# Patient Record
Sex: Male | Born: 1960 | Race: White | Hispanic: No | Marital: Married | Smoking: Current every day smoker
Health system: Southern US, Community
[De-identification: ages and names within clinical notes are randomized; demographics above are authoritative.]

## PROBLEM LIST (undated history)

## (undated) DIAGNOSIS — E119 Type 2 diabetes mellitus without complications: Secondary | ICD-10-CM

## (undated) DIAGNOSIS — I2510 Atherosclerotic heart disease of native coronary artery without angina pectoris: Secondary | ICD-10-CM

## (undated) DIAGNOSIS — E785 Hyperlipidemia, unspecified: Secondary | ICD-10-CM

## (undated) DIAGNOSIS — Z720 Tobacco use: Secondary | ICD-10-CM

## (undated) DIAGNOSIS — E669 Obesity, unspecified: Secondary | ICD-10-CM

## (undated) HISTORY — PX: COLON RESECTION: SHX5231

---

## 2016-12-20 ENCOUNTER — Inpatient Hospital Stay (HOSPITAL_COMMUNITY)
Admission: EM | Admit: 2016-12-20 | Discharge: 2016-12-29 | DRG: 233 | Disposition: A | Discharge status: Home / Self Care | Payer: BLUE CROSS/BLUE SHIELD | Attending: Cardiothoracic Surgery | Admitting: Cardiothoracic Surgery

## 2016-12-20 ENCOUNTER — Emergency Department (HOSPITAL_COMMUNITY): Payer: BLUE CROSS/BLUE SHIELD

## 2016-12-20 ENCOUNTER — Encounter (HOSPITAL_COMMUNITY): Payer: Self-pay | Admitting: *Deleted

## 2016-12-20 DIAGNOSIS — I255 Ischemic cardiomyopathy: Secondary | ICD-10-CM | POA: Diagnosis present

## 2016-12-20 DIAGNOSIS — Z7982 Long term (current) use of aspirin: Secondary | ICD-10-CM

## 2016-12-20 DIAGNOSIS — I214 Non-ST elevation (NSTEMI) myocardial infarction: Principal | ICD-10-CM

## 2016-12-20 DIAGNOSIS — I493 Ventricular premature depolarization: Secondary | ICD-10-CM | POA: Diagnosis present

## 2016-12-20 DIAGNOSIS — E669 Obesity, unspecified: Secondary | ICD-10-CM | POA: Diagnosis present

## 2016-12-20 DIAGNOSIS — I5023 Acute on chronic systolic (congestive) heart failure: Secondary | ICD-10-CM | POA: Diagnosis not present

## 2016-12-20 DIAGNOSIS — E1159 Type 2 diabetes mellitus with other circulatory complications: Secondary | ICD-10-CM

## 2016-12-20 DIAGNOSIS — Z8249 Family history of ischemic heart disease and other diseases of the circulatory system: Secondary | ICD-10-CM

## 2016-12-20 DIAGNOSIS — Z951 Presence of aortocoronary bypass graft: Secondary | ICD-10-CM

## 2016-12-20 DIAGNOSIS — E119 Type 2 diabetes mellitus without complications: Secondary | ICD-10-CM | POA: Diagnosis not present

## 2016-12-20 DIAGNOSIS — E11649 Type 2 diabetes mellitus with hypoglycemia without coma: Secondary | ICD-10-CM | POA: Diagnosis not present

## 2016-12-20 DIAGNOSIS — I2510 Atherosclerotic heart disease of native coronary artery without angina pectoris: Secondary | ICD-10-CM | POA: Diagnosis present

## 2016-12-20 DIAGNOSIS — Z6833 Body mass index (BMI) 33.0-33.9, adult: Secondary | ICD-10-CM

## 2016-12-20 DIAGNOSIS — Z79899 Other long term (current) drug therapy: Secondary | ICD-10-CM

## 2016-12-20 DIAGNOSIS — Z09 Encounter for follow-up examination after completed treatment for conditions other than malignant neoplasm: Secondary | ICD-10-CM

## 2016-12-20 DIAGNOSIS — F17200 Nicotine dependence, unspecified, uncomplicated: Secondary | ICD-10-CM

## 2016-12-20 DIAGNOSIS — E781 Pure hyperglyceridemia: Secondary | ICD-10-CM | POA: Diagnosis present

## 2016-12-20 DIAGNOSIS — I2584 Coronary atherosclerosis due to calcified coronary lesion: Secondary | ICD-10-CM | POA: Diagnosis present

## 2016-12-20 DIAGNOSIS — Z9689 Presence of other specified functional implants: Secondary | ICD-10-CM

## 2016-12-20 DIAGNOSIS — J9811 Atelectasis: Secondary | ICD-10-CM

## 2016-12-20 DIAGNOSIS — K219 Gastro-esophageal reflux disease without esophagitis: Secondary | ICD-10-CM | POA: Diagnosis present

## 2016-12-20 DIAGNOSIS — E785 Hyperlipidemia, unspecified: Secondary | ICD-10-CM | POA: Diagnosis present

## 2016-12-20 DIAGNOSIS — E1165 Type 2 diabetes mellitus with hyperglycemia: Secondary | ICD-10-CM | POA: Diagnosis present

## 2016-12-20 DIAGNOSIS — R079 Chest pain, unspecified: Secondary | ICD-10-CM | POA: Diagnosis not present

## 2016-12-20 DIAGNOSIS — I429 Cardiomyopathy, unspecified: Secondary | ICD-10-CM

## 2016-12-20 DIAGNOSIS — I110 Hypertensive heart disease with heart failure: Secondary | ICD-10-CM | POA: Diagnosis present

## 2016-12-20 DIAGNOSIS — F17210 Nicotine dependence, cigarettes, uncomplicated: Secondary | ICD-10-CM | POA: Diagnosis present

## 2016-12-20 HISTORY — DX: Tobacco use: Z72.0

## 2016-12-20 HISTORY — DX: Obesity, unspecified: E66.9

## 2016-12-20 HISTORY — DX: Type 2 diabetes mellitus without complications: E11.9

## 2016-12-20 HISTORY — DX: Hyperlipidemia, unspecified: E78.5

## 2016-12-20 LAB — CBC
HEMATOCRIT: 46.3 % (ref 39.0–52.0)
Hemoglobin: 16.1 g/dL (ref 13.0–17.0)
MCH: 33.1 pg (ref 26.0–34.0)
MCHC: 34.8 g/dL (ref 30.0–36.0)
MCV: 95.1 fL (ref 78.0–100.0)
Platelets: 152 10*3/uL (ref 150–400)
RBC: 4.87 MIL/uL (ref 4.22–5.81)
RDW: 12.9 % (ref 11.5–15.5)
WBC: 8.6 10*3/uL (ref 4.0–10.5)

## 2016-12-20 LAB — BASIC METABOLIC PANEL
ANION GAP: 9 (ref 5–15)
BUN: 21 mg/dL — ABNORMAL HIGH (ref 6–20)
CO2: 27 mmol/L (ref 22–32)
Calcium: 9 mg/dL (ref 8.9–10.3)
Chloride: 100 mmol/L — ABNORMAL LOW (ref 101–111)
Creatinine, Ser: 0.93 mg/dL (ref 0.61–1.24)
GFR calc non Af Amer: >60 mL/min (ref >60)
Glucose, Bld: 148 mg/dL — ABNORMAL HIGH (ref 65–99)
POTASSIUM: 3.7 mmol/L (ref 3.5–5.1)
SODIUM: 136 mmol/L (ref 135–145)

## 2016-12-20 LAB — I-STAT TROPONIN, ED: Troponin i, poc: 0.3 ng/mL (ref 0.00–0.08)

## 2016-12-20 MED ORDER — HEPARIN BOLUS VIA INFUSION
4000.0000 [IU] | Freq: Once | INTRAVENOUS | Status: AC
  Administered 2016-12-20: 4000 [IU] via INTRAVENOUS

## 2016-12-20 MED ORDER — INSULIN ASPART 100 UNIT/ML ~~LOC~~ SOLN
0.0000 [IU] | Freq: Three times a day (TID) | SUBCUTANEOUS | Status: DC
  Administered 2016-12-21 (×2): 2 [IU] via SUBCUTANEOUS

## 2016-12-20 MED ORDER — ONDANSETRON HCL 4 MG/2ML IJ SOLN
4.0000 mg | Freq: Four times a day (QID) | INTRAMUSCULAR | Status: DC | PRN
  Administered 2016-12-24 – 2016-12-26 (×4): 4 mg via INTRAVENOUS
  Filled 2016-12-20 (×5): qty 2

## 2016-12-20 MED ORDER — ASPIRIN 325 MG PO TABS
325.0000 mg | ORAL_TABLET | Freq: Every day | ORAL | Status: DC
  Filled 2016-12-20: qty 1

## 2016-12-20 MED ORDER — PRAVASTATIN SODIUM 40 MG PO TABS
40.0000 mg | ORAL_TABLET | Freq: Every day | ORAL | Status: DC
  Filled 2016-12-20: qty 1

## 2016-12-20 MED ORDER — FAMOTIDINE 20 MG PO TABS
20.0000 mg | ORAL_TABLET | Freq: Every day | ORAL | Status: DC
  Administered 2016-12-21 – 2016-12-29 (×7): 20 mg via ORAL
  Filled 2016-12-20 (×7): qty 1

## 2016-12-20 MED ORDER — ACETAMINOPHEN 325 MG PO TABS: 650.0000 mg | ORAL_TABLET | ORAL | Status: DC | PRN

## 2016-12-20 MED ORDER — HEPARIN (PORCINE) IN NACL 100-0.45 UNIT/ML-% IJ SOLN
1800.0000 [IU]/h | INTRAMUSCULAR | Status: DC
  Administered 2016-12-20: 1500 [IU]/h via INTRAVENOUS
  Administered 2016-12-21 – 2016-12-22 (×2): 1800 [IU]/h via INTRAVENOUS
  Filled 2016-12-20 (×4): qty 250

## 2016-12-20 MED ORDER — ASPIRIN 81 MG PO CHEW
324.0000 mg | CHEWABLE_TABLET | Freq: Once | ORAL | Status: AC
  Administered 2016-12-20: 324 mg via ORAL
  Filled 2016-12-20: qty 4

## 2016-12-20 NOTE — ED Provider Notes (Signed)
[facility]-EM[facility]ENCY DEPT Provider Note   CSN: [id number] Arrival date & time: 12/20/16  1753     History   Chief Complaint Chief Complaint  Patient presents with  . Chest Pain    HPI Brad Olson is a 56 y.o. male.  HPI Patient presents after episodes of chest pain. He has had 3 episodes of it over the last 2 weeks. Ms. pressure to the left side of the chest. It is come on after eating and relieved by antacids. States he had it Saturday evening. Has not had pain in a couple days. States that he helped his son move and did not have pain during that but that evening did have some pain. Family member states he looked like someone that they had seen having a heart attack. He is a smoker. History of diabetes and hypertension and high cholesterol. No previous cardiac workup. No diaphoresis. Pain had been severe. Pain-free now. Not described as a tearing pain. No abdominal pain.   Past Medical History:  Diagnosis Date  . Diabetes mellitus without complication (HCC)   . Hypertension     Patient Active Problem List   Diagnosis Date Noted  . NSTEMI (non-ST elevated myocardial infarction) (HCC) 12/20/2016  . Diabetes mellitus (HCC) 12/20/2016    Past Surgical History:  Procedure Laterality Date  . COLON RESECTION     due to bowel rupture       Home Medications    Prior to Admission medications   Medication Sig Start Date End Date Taking? Authorizing Provider  aspirin EC 81 MG tablet Take 81 mg by mouth daily.   Yes Historical Provider, MD  DiphenhydrAMINE HCl, Sleep, (SLEEP AID) 50 MG CAPS Take 50 mg by mouth at bedtime.   Yes Historical Provider, MD  glyBURIDE (DIABETA) 5 MG tablet Take 5 mg by mouth 2 (two) times daily. 11/10/16  Yes Historical Provider, MD  JARDIANCE 25 MG TABS tablet Take 25 mg by mouth daily. 11/22/16  Yes Historical Provider, MD  Multiple Vitamin (MULTIVITAMIN WITH MINERALS) TABS tablet Take 1 tablet by mouth daily.   Yes Historical Provider, MD    pravastatin (PRAVACHOL) 40 MG tablet Take 40 mg by mouth daily. 11/18/16  Yes Historical Provider, MD  ranitidine (ZANTAC) 75 MG tablet Take 150 mg by mouth every evening.   Yes Historical Provider, MD    Family History No family history on file.  Social History Social History  Substance Use Topics  . Smoking status: Current Every Day Smoker    Packs/day: 1.00  . Smokeless tobacco: Never Used  . Alcohol use No     Allergies   Nicotine   Review of Systems Review of Systems  Constitutional: Negative for appetite change, diaphoresis and fever.  Cardiovascular: Positive for chest pain.  Gastrointestinal: Negative for abdominal pain.  Genitourinary: Negative for flank pain.  Musculoskeletal: Negative for back pain.  Neurological: Negative for syncope and numbness.  Hematological: Negative for adenopathy.     Physical Exam Updated Vital Signs BP 134/73   Pulse 79   Temp 98.4 F (36.9 C) (Oral)   Resp 18   Ht 5\' 11"  (1.803 m)   Wt 241 lb (109.3 kg)   SpO2 96%   BMI 33.61 kg/m   Physical Exam  Constitutional: He appears well-developed.  HENT:  Head: Atraumatic.  Eyes: EOM are normal.  Cardiovascular: Normal rate.   Pulmonary/Chest: Effort normal.  Abdominal: Soft. There is no tenderness.  Musculoskeletal: He exhibits no edema.  Neurological: He is  alert.  Skin: Skin is warm. Capillary refill takes less than 2 seconds.    4 views of 3 a ED Treatments / Results  Labs (all labs ordered are listed, but only abnormal results are displayed) Labs Reviewed  BASIC METABOLIC PANEL - Abnormal; Notable for the following:       Result Value   Chloride 100 (*)    Glucose, Bld 148 (*)    BUN 21 (*)    All other components within normal limits  I-STAT TROPOININ, ED - Abnormal; Notable for the following:    Troponin i, poc 0.30 (*)    All other components within normal limits  CBC  HEPARIN LEVEL (UNFRACTIONATED)  CBC    EKG  EKG  Interpretation  Date/Time:  Monday December 20 2016 17:57:49 EDT Ventricular Rate:  90 PR Interval:  180 QRS Duration: 96 QT Interval:  370 QTC Calculation: 452 R Axis:   -34 Text Interpretation:  Sinus rhythm with frequent Premature ventricular complexes Left axis deviation Septal infarct , age undetermined Abnormal ECG Confirmed by Rubin Payor  MD, Denym Christenberry [phone]) on 12/20/2016 6:57:08 PM       Radiology Dg Chest 2 View  Result Date: 12/20/2016 CLINICAL DATA:  Left-sided chest pain radiates into the shoulder. EXAM: CHEST  2 VIEW COMPARISON:  None. FINDINGS: The heart size and mediastinal contours are within normal limits. Both lungs are clear. The visualized skeletal structures are unremarkable. IMPRESSION: No active cardiopulmonary disease. Electronically Signed   By: Kennith Center M.D.   On: 12/20/2016 18:27    Procedures Procedures (including critical care time)  Medications Ordered in ED Medications  heparin ADULT infusion 100 units/mL (25000 units/250mL sodium chloride 0.45%) (1,500 Units/hr Intravenous New Bag/Given 12/20/16 2105)  aspirin chewable tablet 324 mg (324 mg Oral Given 12/20/16 2023)  heparin bolus via infusion 4,000 Units (4,000 Units Intravenous Bolus from Bag 12/20/16 2105)     Initial Impression / Assessment and Plan / ED Course  I have reviewed the triage vital signs and the nursing notes.  Pertinent labs & imaging results that were available during my care of the patient were reviewed by me and considered in my medical decision making (see chart for details).     Patient with chest pain. Has not had chest pain a couple days however the story was somewhat worrisome and that it was pressure on the left side of his chest. He did however not come on with exertion. EKG reassuring but his initial troponin is 0.3. Heparin started. Discussed with Dr. Diona Browner cardiology. Thinks the patient can remain up at [facility] for further evaluation unless the troponin significantly  elevates her pain returns. Admitted to internal medicine.  CRITICAL CARE Performed by: Billee Cashing Total critical care time: 30 minutes Critical care time was exclusive of separately billable procedures and treating other patients. Critical care was necessary to treat or prevent imminent or life-threatening deterioration. Critical care was time spent personally by me on the following activities: development of treatment plan with patient and/or surrogate as well as nursing, discussions with consultants, evaluation of patient's response to treatment, examination of patient, obtaining history from patient or surrogate, ordering and performing treatments and interventions, ordering and review of laboratory studies, ordering and review of radiographic studies, pulse oximetry and re-evaluation of patient's condition.   Final Clinical Impressions(s) / ED Diagnoses   Final diagnoses:  NSTEMI (non-ST elevated myocardial infarction) [employer])    New Prescriptions New Prescriptions   No medications on file  Benjiman Core, MD 12/20/16 2141

## 2016-12-20 NOTE — ED Triage Notes (Signed)
Pt c/o left sided chest pain 1 week ago. Pt took some heartburn medication with relief after a few minutes. Pt saw his PCP today and wanted pt to be seen due to the CP and risk factors of DM, HTN, smoker. Pt denies radiation of pain, nausea, vomiting, dizziness. Denies chest pain at this time.

## 2016-12-20 NOTE — ED Notes (Signed)
Dr. Lama at bedside. 

## 2016-12-20 NOTE — Progress Notes (Signed)
ANTICOAGULATION CONSULT NOTE - Initial Consult  Pharmacy Consult for heparin Indication: chest pain/ACS  Allergies  Allergen Reactions  . Nicotine Rash    Nicotine patch only.    Patient Measurements: Height: 5\' 11"  (180.3 cm) Weight: 241 lb (109.3 kg) IBW/kg (Calculated) : 75.3 Heparin Dosing Weight: 98.7 kg  Vital Signs: Temp: 98.4 F (36.9 C) (04/09 1802) Temp Source: Oral (04/09 1802) BP: 150/86 (04/09 1911) Pulse Rate: 90 (04/09 1911)  Labs: No results for input(s): HGB, HCT, PLT, APTT, LABPROT, INR, HEPARINUNFRC, HEPRLOWMOCWT, CREATININE, CKTOTAL, CKMB, TROPONINI in the last 72 hours.  CrCl cannot be calculated (No order found.).   Medical History: Past Medical History:  Diagnosis Date  . Diabetes mellitus without complication (HCC)   . Hypertension     Medications:  See medication history  Assessment: 56 yo man to start heparin for CP.  He was not on anticoagulation PTA Goal of Therapy:  Heparin level 0.3-0.7 units/ml Monitor platelets by anticoagulation protocol: Yes   Plan:  Heparin 4000 unit bolus and drip at 1500 units/hr Check heparin level ~8 hours after start and daily while on heparin CBC daily Monitor for bleeding complications  Kelley Knoth Poteet 12/20/2016,7:59 PM

## 2016-12-20 NOTE — H&P (Signed)
[facility] H&P    Patient Demographics:    Brad Olson, is a 56 y.o. male  MRN: [id number]  DOB - 08-24-1961  Admit Date - 12/20/2016  Referring MD/NP/PA: Dr. Rubin Payor  Outpatient Primary MD for the patient is Vennie Homans, MD  Patient coming from: Home  Chief Complaint  Patient presents with  . Chest Pain      HPI:    Brad Olson  is a 56 y.o. male, With history of diabetes mellitus, hyperlipidemia who came to ED with complaint of chest pain was started for past 2 weeks. Patient had 3 episodes of chest pain or past 2 weeks. On Saturday he felt pressure feeling in the left side of chest. It improved after taking some antacids. Patient says that he was on a trip to [place] for his job and stated that from Monday to Friday. He came back on Friday night and had pain on Saturday. He does not have any history of CAD. Does have strong family history of CAD. Patient's father had MI in his 37s. Patient has a history of diabetes mellitus, hypertension, hyperlipidemia.  In ED, lab work showed elevated troponin 0.30. Cardiology was consulted by ED physician, Dr. Lilian Kapur recommended to keep patient at [facility]. Patient started on heparin protocol, given aspirin. He is currently chest pain-free.  Patient denies history of shortness of breath, no nausea vomiting or diarrhea.    Review of systems:    In addition to the HPI above,  No Fever-chills, No Headache, No changes with Vision or hearing, No problems swallowing food or Liquids, No Abdominal pain, No Nausea or Vomiting, bowel movements are regular, No Blood in stool or Urine, No dysuria, No new skin rashes or bruises, No new joints pains-aches,  No new weakness, tingling, numbness in any extremity, No recent weight gain or loss, No polyuria, polydypsia or polyphagia, No significant Mental Stressors.  A full 10 point Review of Systems  was done, except as stated above, all other Review of Systems were negative.   With Past History of the following :    Past Medical History:  Diagnosis Date  . Diabetes mellitus without complication (HCC)   . Hypertension       Past Surgical History:  Procedure Laterality Date  . COLON RESECTION     due to bowel rupture      Social History:      Social History  Substance Use Topics  . Smoking status: Current Every Day Smoker    Packs/day: 1.00  . Smokeless tobacco: Never Used  . Alcohol use No       Family History :    Patient's father had MI in his 50s   Home Medications:   Prior to Admission medications   Medication Sig Start Date End Date Taking? Authorizing Provider  aspirin EC 81 MG tablet Take 81 mg by mouth daily.   Yes Historical Provider, MD  DiphenhydrAMINE HCl, Sleep, (SLEEP AID) 50 MG CAPS Take 50 mg by mouth at bedtime.   Yes Historical Provider, MD  glyBURIDE (DIABETA) 5 MG tablet Take 5 mg by mouth 2 (two) times daily. 11/10/16  Yes Historical Provider, MD  JARDIANCE 25 MG TABS tablet Take 25 mg by mouth daily. 11/22/16  Yes Historical Provider, MD  Multiple Vitamin (MULTIVITAMIN WITH MINERALS) TABS tablet Take 1 tablet by mouth daily.   Yes Historical Provider, MD  pravastatin (PRAVACHOL) 40 MG tablet Take 40 mg by mouth daily. 11/18/16  Yes Historical Provider, MD  ranitidine (ZANTAC) 75 MG tablet Take 150 mg by mouth every evening.   Yes Historical Provider, MD     Allergies:     Allergies  Allergen Reactions  . Nicotine Rash    Nicotine patch only.     Physical Exam:   Vitals  Blood pressure (!) 149/76, pulse 87, temperature 98.4 F (36.9 C), temperature source Oral, resp. rate 15, height 5\' 11"  (1.803 m), weight 109.3 kg (241 lb), SpO2 96 %.  1.  General: Appears in no acute distress  2. Psychiatric:  Intact judgement and  insight, awake alert, oriented x 3.  3. Neurologic: No focal neurological deficits, all cranial nerves  intact.Strength 5/5 all 4 extremities, sensation intact all 4 extremities, plantars down going.  4. Eyes :  anicteric sclerae, moist conjunctivae with no lid lag. PERRLA.  5. ENMT:  Oropharynx clear with moist mucous membranes and good dentition  6. Neck:  supple, no cervical lymphadenopathy appriciated, No thyromegaly  7. Respiratory : Normal respiratory effort, good air movement bilaterally,clear to  auscultation bilaterally  8. Cardiovascular : RRR, no gallops, rubs or murmurs, no leg edema  9. Gastrointestinal:  Positive bowel sounds, abdomen soft, non-tender to palpation,no hepatosplenomegaly, no rigidity or guarding       10. Skin:  No cyanosis, normal texture and turgor, no rash, lesions or ulcers  11.Musculoskeletal:  Good muscle tone,  joints appear normal , no effusions,  normal range of motion    Data Review:    CBC  Recent Labs Lab 12/20/16 1911  WBC 8.6  HGB 16.1  HCT 46.3  PLT 152  MCV 95.1  MCH 33.1  MCHC 34.8  RDW 12.9   ------------------------------------------------------------------------------------------------------------------  Chemistries   Recent Labs Lab 12/20/16 1911  NA 136  K 3.7  CL 100*  CO2 27  GLUCOSE 148*  BUN 21*  CREATININE 0.93  CALCIUM 9.0   ------------------------------------------------------------------------------------------------------------------  ------------------------------------------------------------------------------------------------------------------  --------------------------------------------------------------------------------------------------------------- Urine analysis: No results found for: COLORURINE, APPEARANCEUR, LABSPEC, PHURINE, GLUCOSEU, HGBUR, BILIRUBINUR, KETONESUR, PROTEINUR, UROBILINOGEN, NITRITE, LEUKOCYTESUR    Imaging Results:    Dg Chest 2 View  Result Date: 12/20/2016 CLINICAL DATA:  Left-sided chest pain radiates into the shoulder. EXAM: CHEST  2 VIEW COMPARISON:   None. FINDINGS: The heart size and mediastinal contours are within normal limits. Both lungs are clear. The visualized skeletal structures are unremarkable. IMPRESSION: No active cardiopulmonary disease. Electronically Signed   By: Kennith Center M.D.   On: 12/20/2016 18:27    My personal review of EKG: Rhythm NSR with PVCs, left axis deviation   Assessment & Plan:    Active Problems:   NSTEMI (non-ST elevated myocardial infarction) (HCC)   Diabetes mellitus (HCC)   1. Acute coronary syndrome- patient came with chest pain, which is currently resolved. Found to have elevated troponin 0.30, patient started on heparin protocol, continue aspirin 325 mg by mouth daily. Will obtain echocardiogram in a.m. Cardiology to see in a.m. for cardiac stress test. 2. Diabetes mellitus-hold glyburide and Jardiance. We'll start sliding scale insulin with NovoLog. 3.  Hyperlipidemia-continue Pravachol 4. GERD-continue Zantac.   DVT Prophylaxis-   Heparin  AM Labs Ordered, also please review Full Orders  Family Communication: Admission, patients condition and plan of care including tests being ordered have been discussed with the patient and * who indicate understanding and agree with the plan and Code Status.  Code Status: Full code  Admission status: Observation    Time spent in minutes : 60 minutes   Eusebia Grulke S M.D on 12/20/2016 at 8:37 PM  Between 7am to 7pm - Pager - [phone]. After 7pm go to [email or web address] - password [facility]  [facility] - Office  [phone]

## 2016-12-21 ENCOUNTER — Observation Stay (HOSPITAL_BASED_OUTPATIENT_CLINIC_OR_DEPARTMENT_OTHER): Payer: BLUE CROSS/BLUE SHIELD

## 2016-12-21 ENCOUNTER — Encounter (HOSPITAL_COMMUNITY): Payer: Self-pay | Admitting: Physician Assistant

## 2016-12-21 DIAGNOSIS — I25110 Atherosclerotic heart disease of native coronary artery with unstable angina pectoris: Secondary | ICD-10-CM | POA: Diagnosis not present

## 2016-12-21 DIAGNOSIS — F17200 Nicotine dependence, unspecified, uncomplicated: Secondary | ICD-10-CM

## 2016-12-21 DIAGNOSIS — K219 Gastro-esophageal reflux disease without esophagitis: Secondary | ICD-10-CM | POA: Diagnosis present

## 2016-12-21 DIAGNOSIS — I214 Non-ST elevation (NSTEMI) myocardial infarction: Secondary | ICD-10-CM | POA: Diagnosis present

## 2016-12-21 DIAGNOSIS — E781 Pure hyperglyceridemia: Secondary | ICD-10-CM | POA: Diagnosis present

## 2016-12-21 DIAGNOSIS — E11649 Type 2 diabetes mellitus with hypoglycemia without coma: Secondary | ICD-10-CM | POA: Diagnosis not present

## 2016-12-21 DIAGNOSIS — I420 Dilated cardiomyopathy: Secondary | ICD-10-CM | POA: Diagnosis not present

## 2016-12-21 DIAGNOSIS — J9811 Atelectasis: Secondary | ICD-10-CM | POA: Diagnosis not present

## 2016-12-21 DIAGNOSIS — I110 Hypertensive heart disease with heart failure: Secondary | ICD-10-CM | POA: Diagnosis present

## 2016-12-21 DIAGNOSIS — E785 Hyperlipidemia, unspecified: Secondary | ICD-10-CM

## 2016-12-21 DIAGNOSIS — I498 Other specified cardiac arrhythmias: Secondary | ICD-10-CM

## 2016-12-21 DIAGNOSIS — R079 Chest pain, unspecified: Secondary | ICD-10-CM

## 2016-12-21 DIAGNOSIS — I493 Ventricular premature depolarization: Secondary | ICD-10-CM | POA: Diagnosis present

## 2016-12-21 DIAGNOSIS — Z6833 Body mass index (BMI) 33.0-33.9, adult: Secondary | ICD-10-CM | POA: Diagnosis not present

## 2016-12-21 DIAGNOSIS — R072 Precordial pain: Secondary | ICD-10-CM | POA: Diagnosis not present

## 2016-12-21 DIAGNOSIS — Z720 Tobacco use: Secondary | ICD-10-CM | POA: Diagnosis not present

## 2016-12-21 DIAGNOSIS — E669 Obesity, unspecified: Secondary | ICD-10-CM | POA: Diagnosis present

## 2016-12-21 DIAGNOSIS — Z79899 Other long term (current) drug therapy: Secondary | ICD-10-CM | POA: Diagnosis not present

## 2016-12-21 DIAGNOSIS — F17210 Nicotine dependence, cigarettes, uncomplicated: Secondary | ICD-10-CM | POA: Diagnosis present

## 2016-12-21 DIAGNOSIS — Z951 Presence of aortocoronary bypass graft: Secondary | ICD-10-CM | POA: Diagnosis not present

## 2016-12-21 DIAGNOSIS — Z8249 Family history of ischemic heart disease and other diseases of the circulatory system: Secondary | ICD-10-CM | POA: Diagnosis not present

## 2016-12-21 DIAGNOSIS — Z01810 Encounter for preprocedural cardiovascular examination: Secondary | ICD-10-CM | POA: Diagnosis not present

## 2016-12-21 DIAGNOSIS — E1159 Type 2 diabetes mellitus with other circulatory complications: Secondary | ICD-10-CM | POA: Diagnosis not present

## 2016-12-21 DIAGNOSIS — I5023 Acute on chronic systolic (congestive) heart failure: Secondary | ICD-10-CM | POA: Diagnosis not present

## 2016-12-21 DIAGNOSIS — I2584 Coronary atherosclerosis due to calcified coronary lesion: Secondary | ICD-10-CM | POA: Diagnosis present

## 2016-12-21 DIAGNOSIS — Z7982 Long term (current) use of aspirin: Secondary | ICD-10-CM | POA: Diagnosis not present

## 2016-12-21 DIAGNOSIS — E782 Mixed hyperlipidemia: Secondary | ICD-10-CM

## 2016-12-21 DIAGNOSIS — E1165 Type 2 diabetes mellitus with hyperglycemia: Secondary | ICD-10-CM | POA: Diagnosis present

## 2016-12-21 DIAGNOSIS — Z716 Tobacco abuse counseling: Secondary | ICD-10-CM

## 2016-12-21 DIAGNOSIS — I2510 Atherosclerotic heart disease of native coronary artery without angina pectoris: Secondary | ICD-10-CM | POA: Diagnosis present

## 2016-12-21 DIAGNOSIS — I255 Ischemic cardiomyopathy: Secondary | ICD-10-CM | POA: Diagnosis present

## 2016-12-21 DIAGNOSIS — I429 Cardiomyopathy, unspecified: Secondary | ICD-10-CM

## 2016-12-21 LAB — LIPID PANEL
Cholesterol: 202 mg/dL — ABNORMAL HIGH (ref 0–200)
HDL: 37 mg/dL — ABNORMAL LOW (ref >40)
LDL CALC: UNDETERMINED mg/dL (ref 0–99)
Total CHOL/HDL Ratio: 5.5 RATIO
Triglycerides: 444 mg/dL — ABNORMAL HIGH (ref <150)
VLDL: UNDETERMINED mg/dL (ref 0–40)

## 2016-12-21 LAB — GLUCOSE, CAPILLARY
GLUCOSE-CAPILLARY: 177 mg/dL — AB (ref 65–99)
GLUCOSE-CAPILLARY: 211 mg/dL — AB (ref 65–99)
Glucose-Capillary: 162 mg/dL — ABNORMAL HIGH (ref 65–99)

## 2016-12-21 LAB — ECHOCARDIOGRAM COMPLETE
CHL CUP MV DEC (S): 155
E decel time: 155 msec
EERAT: 10.14
FS: 15 % — AB (ref 28–44)
Height: 71 in
IV/PV OW: 0.91
LA ID, A-P, ES: 38 mm
LA diam end sys: 38 mm
LA diam index: 1.67 cm/m2
LA vol A4C: 37.3 ml
LA vol index: 25.6 mL/m2
LA vol: 58.3 mL
LDCA: 3.14 cm2
LV E/e' medial: 10.14
LV E/e'average: 10.14
LV TDI E'LATERAL: 7.18
LV TDI E'MEDIAL: 5.22
LV e' LATERAL: 7.18 cm/s
LVOTD: 20 mm
MV Peak grad: 2 mmHg
MV pk A vel: 63.1 m/s
MV pk E vel: 72.8 m/s
PW: 17.1 mm — AB (ref 0.6–1.1)
RV LATERAL S' VELOCITY: 15.7 cm/s
TAPSE: 28.1 mm
Weight: 3840 oz

## 2016-12-21 LAB — MAGNESIUM: Magnesium: 2.4 mg/dL (ref 1.7–2.4)

## 2016-12-21 LAB — TROPONIN I
TROPONIN I: 0.47 ng/mL — AB (ref <0.03)
TROPONIN I: 0.33 ng/mL — AB (ref <0.03)

## 2016-12-21 LAB — CBC
HCT: 45.9 % (ref 39.0–52.0)
Hemoglobin: 15.6 g/dL (ref 13.0–17.0)
MCH: 32.5 pg (ref 26.0–34.0)
MCHC: 34 g/dL (ref 30.0–36.0)
MCV: 95.6 fL (ref 78.0–100.0)
PLATELETS: 145 10*3/uL — AB (ref 150–400)
RBC: 4.8 MIL/uL (ref 4.22–5.81)
RDW: 12.9 % (ref 11.5–15.5)
WBC: 8.1 10*3/uL (ref 4.0–10.5)

## 2016-12-21 LAB — HEPARIN LEVEL (UNFRACTIONATED)
Heparin Unfractionated: <0.1 IU/mL — ABNORMAL LOW (ref 0.30–0.70)
Heparin Unfractionated: 0.43 IU/mL (ref 0.30–0.70)

## 2016-12-21 LAB — D-DIMER, QUANTITATIVE (NOT AT ARMC)

## 2016-12-21 MED ORDER — FENOFIBRATE 160 MG PO TABS
160.0000 mg | ORAL_TABLET | Freq: Every day | ORAL | Status: DC
  Administered 2016-12-21: 160 mg via ORAL
  Filled 2016-12-21: qty 1

## 2016-12-21 MED ORDER — LISINOPRIL 2.5 MG PO TABS
2.5000 mg | ORAL_TABLET | Freq: Every day | ORAL | Status: DC
  Administered 2016-12-21 – 2016-12-25 (×4): 2.5 mg via ORAL
  Filled 2016-12-21 (×5): qty 1

## 2016-12-21 MED ORDER — INSULIN ASPART 100 UNIT/ML ~~LOC~~ SOLN: 0.0000 [IU] | Freq: Three times a day (TID) | SUBCUTANEOUS | Status: DC

## 2016-12-21 MED ORDER — INSULIN ASPART 100 UNIT/ML ~~LOC~~ SOLN
0.0000 [IU] | Freq: Every day | SUBCUTANEOUS | Status: DC
  Administered 2016-12-21: 2 [IU] via SUBCUTANEOUS

## 2016-12-21 MED ORDER — ATORVASTATIN CALCIUM 80 MG PO TABS
80.0000 mg | ORAL_TABLET | Freq: Every day | ORAL | Status: DC
  Administered 2016-12-23 – 2016-12-28 (×6): 80 mg via ORAL
  Filled 2016-12-21 (×7): qty 1

## 2016-12-21 MED ORDER — SODIUM CHLORIDE 0.9% FLUSH: 3.0000 mL | INTRAVENOUS | Status: DC | PRN

## 2016-12-21 MED ORDER — CARVEDILOL 3.125 MG PO TABS
3.1250 mg | ORAL_TABLET | Freq: Two times a day (BID) | ORAL | Status: DC
  Administered 2016-12-21 – 2016-12-22 (×2): 3.125 mg via ORAL
  Filled 2016-12-21 (×2): qty 1

## 2016-12-21 MED ORDER — METOPROLOL TARTRATE 25 MG PO TABS
25.0000 mg | ORAL_TABLET | Freq: Two times a day (BID) | ORAL | Status: DC
  Administered 2016-12-21: 25 mg via ORAL
  Filled 2016-12-21: qty 1

## 2016-12-21 MED ORDER — HEPARIN BOLUS VIA INFUSION
2000.0000 [IU] | Freq: Once | INTRAVENOUS | Status: AC
  Administered 2016-12-21: 2000 [IU] via INTRAVENOUS
  Filled 2016-12-21: qty 2000

## 2016-12-21 MED ORDER — SODIUM CHLORIDE 0.9 % IV SOLN
INTRAVENOUS | Status: DC
  Administered 2016-12-22: 06:00:00 via INTRAVENOUS

## 2016-12-21 MED ORDER — POTASSIUM CHLORIDE CRYS ER 20 MEQ PO TBCR
20.0000 meq | EXTENDED_RELEASE_TABLET | Freq: Once | ORAL | Status: AC
  Administered 2016-12-21: 20 meq via ORAL
  Filled 2016-12-21: qty 1

## 2016-12-21 MED ORDER — SODIUM CHLORIDE 0.9% FLUSH: 3.0000 mL | Freq: Two times a day (BID) | INTRAVENOUS | Status: DC

## 2016-12-21 MED ORDER — ASPIRIN EC 81 MG PO TBEC
81.0000 mg | DELAYED_RELEASE_TABLET | Freq: Every day | ORAL | Status: DC
  Administered 2016-12-21: 81 mg via ORAL
  Filled 2016-12-21: qty 1

## 2016-12-21 MED ORDER — ASPIRIN 81 MG PO CHEW
81.0000 mg | CHEWABLE_TABLET | ORAL | Status: AC
  Administered 2016-12-22: 81 mg via ORAL
  Filled 2016-12-21: qty 1

## 2016-12-21 MED ORDER — SODIUM CHLORIDE 0.9 % IV SOLN
250.0000 mL | INTRAVENOUS | Status: DC | PRN
  Administered 2016-12-22: 19:00:00 via INTRAVENOUS

## 2016-12-21 MED ORDER — PERFLUTREN LIPID MICROSPHERE
1.0000 mL | INTRAVENOUS | Status: AC | PRN
  Administered 2016-12-21: 2 mL via INTRAVENOUS
  Filled 2016-12-21: qty 10

## 2016-12-21 NOTE — Progress Notes (Signed)
PROGRESS NOTE                                                                                                                                                                                                             Patient Demographics:    Brad Olson, is a 56 y.o. male, DOB - [birth year], JYN:[id number]  Admit date - 12/20/2016   Admitting Physician Meredeth Ide, MD  Outpatient Primary MD for the patient is Vennie Homans, MD  LOS - 0  Chief Complaint  Patient presents with  . Chest Pain       Brief Narrative  Brad Olson  is a 56 y.o. male, With history of diabetes mellitus, hyperlipidemia who came to ED with complaint of chest pain was started for past 2 weeks. Patient had 3 episodes of chest pain or past 2 weeks. On Saturday he felt pressure feeling in the left side of chest. It improved after taking some antacids. Patient says that he was on a trip to [place] for his job and stated that from Monday to Friday.   Subjective:    Brad Olson today has, No headache, No chest pain, No abdominal pain - No Nausea, No new weakness tingling or numbness, No Cough - SOB.     Assessment  & Plan :      1.NSTEMI with frequent PVCs, Newly diagnosed likely ischemic cardiomyopathy with an EF of 30%. Currently chest pain-free and appears to be compensated, currently on heparin drip along with Coreg, ACE, statin, TriCor and aspirin for secondary prevention, and by cardiology will be transferred to cardiology service for left heart cath at [facility].  2. Dyslipidemia. Placed on statin and TriCor  3. Hypertension. Continue on Coreg for now along with ACE inhibitor.  4. DM type II. Check A1c place on sliding scale    Diet : Diet Carb Modified Fluid consistency: Thin; Room service appropriate? Yes    Family Communication  :  None  Code Status :  Full  Disposition Plan  :  [facility] for Cath  Consults   :  Cards  Procedures  :    TTE - Left ventricle: The cavity size was normal. Wall thickness was increased in a pattern of moderate LVH. Systolic function was severely reduced. The estimated ejection fraction was 30%. Features are consistent with a  pseudonormal left ventricular filling pattern, with concomitant abnormal relaxation and increased filling pressure (grade 2 diastolic dysfunction). - Regional wall motion abnormality: Hypokinesis of the apical anterior, mid inferior, mid inferolateral, mid anterolateral, apical lateral, and apical myocardium. - Aortic valve: Mildly calcified annulus. Trileaflet.  DVT Prophylaxis  :   Heparin   Lab Results  Component Value Date   PLT 145 (L) 12/21/2016    Inpatient Medications  Scheduled Meds: . aspirin EC  81 mg Oral Daily  . atorvastatin  80 mg Oral q1800  . carvedilol  3.125 mg Oral BID WC  . famotidine  20 mg Oral Daily  . fenofibrate  160 mg Oral Daily  . insulin aspart  0-5 Units Subcutaneous QHS  . insulin aspart  0-9 Units Subcutaneous TID WC  . lisinopril  2.5 mg Oral Daily   Continuous Infusions: . heparin 1,800 Units/hr (12/21/16 1151)   PRN Meds:.acetaminophen, ondansetron (ZOFRAN) IV  Antibiotics  :    Anti-infectives    None         Objective:   Vitals:   12/20/16 2230 12/20/16 2313 12/21/16 0358 12/21/16 1013  BP: 134/72 133/76 126/60 (!) 142/81  Pulse: 79 86 79 76  Resp: 19 18 18    Temp:  98.2 F (36.8 C) 97.6 F (36.4 C)   TempSrc:  Oral Oral   SpO2: 95% 96% 96%   Weight:  108.9 kg (240 lb)    Height:  5\' 11"  (1.803 m)      Wt Readings from Last 3 Encounters:  12/20/16 108.9 kg (240 lb)     Intake/Output Summary (Last 24 hours) at 12/21/16 1231 Last data filed at 12/21/16 0530  Gross per 24 hour  Intake           126.25 ml  Output                0 ml  Net           126.25 ml     Physical Exam  Awake Alert, Oriented X 3, No new F.N deficits, Normal affect [place].AT,PERRAL Supple Neck,No  JVD, No cervical lymphadenopathy appriciated.  Symmetrical Chest wall movement, Good air movement bilaterally, CTAB RRR,No Gallops,Rubs or new Murmurs, No Parasternal Heave +ve B.Sounds, Abd Soft, No tenderness, No organomegaly appriciated, No rebound - guarding or rigidity. No Cyanosis, Clubbing or edema, No new Rash or bruise       Data Review:    CBC  Recent Labs Lab 12/20/16 1911 12/21/16 0526  WBC 8.6 8.1  HGB 16.1 15.6  HCT 46.3 45.9  PLT 152 145*  MCV 95.1 95.6  MCH 33.1 32.5  MCHC 34.8 34.0  RDW 12.9 12.9    Chemistries   Recent Labs Lab 12/20/16 1911 12/21/16 0526  NA 136  --   K 3.7  --   CL 100*  --   CO2 27  --   GLUCOSE 148*  --   BUN 21*  --   CREATININE 0.93  --   CALCIUM 9.0  --   MG  --  2.4   ------------------------------------------------------------------------------------------------------------------  Recent Labs  12/21/16 0526  CHOL 202*  HDL 37*  LDLCALC UNABLE TO CALCULATE IF TRIGLYCERIDE OVER 400 mg/dL  TRIG 191*  CHOLHDL 5.5    No results found for: HGBA1C ------------------------------------------------------------------------------------------------------------------ No results for input(s): TSH, T4TOTAL, T3FREE, THYROIDAB in the last 72 hours.  Invalid input(s): FREET3 ------------------------------------------------------------------------------------------------------------------ No results for input(s): VITAMINB12, FOLATE, FERRITIN, TIBC, IRON, RETICCTPCT in the last  72 hours.  Coagulation profile No results for input(s): INR, PROTIME in the last 168 hours.   Recent Labs  12/21/16 0526  DDIMER <0.27    Cardiac Enzymes  Recent Labs Lab 12/20/16 2327 12/21/16 0526  TROPONINI 0.47* 0.33*   ------------------------------------------------------------------------------------------------------------------ No results found for: BNP  Micro Results No results found for this or any previous visit (from the  past 240 hour(s)).  Radiology Reports Dg Chest 2 View  Result Date: 12/20/2016 CLINICAL DATA:  Left-sided chest pain radiates into the shoulder. EXAM: CHEST  2 VIEW COMPARISON:  None. FINDINGS: The heart size and mediastinal contours are within normal limits. Both lungs are clear. The visualized skeletal structures are unremarkable. IMPRESSION: No active cardiopulmonary disease. Electronically Signed   By: Kennith Center M.D.   On: 12/20/2016 18:27    Time Spent in minutes  30   Susa Raring M.D on 12/21/2016 at 12:31 PM  Between 7am to 7pm - Pager - [phone] ( page via [facility], text pages only, please mention full 10 digit call back number).  After 7pm go to [email or web address] - password [facility]  [facility] -  Office  [phone]

## 2016-12-21 NOTE — Consult Note (Addendum)
Cardiology Consultation Note    Patient ID: Buren Havey, MRN: [id number], DOB/AGE: 03-31-1961 56 y.o. Admit date: 12/20/2016   Date of Consult: 12/21/2016 Primary Physician: Vennie Homans, MD Primary Cardiologist: New to Dr. Purvis Sheffield  Chief Complaint: chest pain Reason for Consultation: elevated troponin (0.47) Requesting MD: Dr. Thedore Mins  HPI: Ransom Nickson is a 56 y.o. male who is being seen today for the evaluation of elevated troponin at the request of Dr. Thedore Mins. The patient has a history of DM, hyperlipidemia, 30 years of tobacco abuse. obesity and family history of CAD (father died of MI age 66, first MI was a few years earlier). He recently flew to [place], [place] for a business trip. One evening after eating dinner he developed left sided chest pain which felt like indigestion, lasting a half hour, improved with antacids. Friday 4/6 was a long busy day with conferences - started at 6am for meetings, left [place] and flew to [place] that evening, getting in at 3:30am on Saturday 4/7. His son woke up him at 7am to help him move into his new house. The patient did so without any issues all day long - he does report he's slowed down over the years but has attributed this to getting older. Later that evening while resting he developed a sensation of chest pain described as indigestion in the left side of his chest. It was not worse with anything, just "hard to find relief." After about a half hour he tried taking antacids. His pain eventually eased off after 45 minutes. He felt somewhat nauseated. No SOB, sweating, palpitations. His wife checked his BP and said it was high and pulse was around 100. He hasn't had any pain since. His family became concerned about possibility of gallbladder issue so recommended he come to the hospital to get checked out. He's been pain free since Saturday 4/7. Troponins 0.30->0.47->0.33, Hgb 15.6, glucose 148, K 3.7. CXR NAD. EKG with NSR with trigeminy, otherwise  no acute ST-T changes. Telemetry with frequent PVCs, bigeminy and trigeminy.    Past Medical History:  Diagnosis Date  . Diabetes mellitus without complication (HCC)   . Hyperlipidemia   . Tobacco abuse       Surgical History:  Past Surgical History:  Procedure Laterality Date  . COLON RESECTION     due to bowel rupture     Home Meds: Prior to Admission medications   Medication Sig Start Date End Date Taking? Authorizing Provider  aspirin EC 81 MG tablet Take 81 mg by mouth daily.   Yes Historical Provider, MD  DiphenhydrAMINE HCl, Sleep, (SLEEP AID) 50 MG CAPS Take 50 mg by mouth at bedtime.   Yes Historical Provider, MD  glyBURIDE (DIABETA) 5 MG tablet Take 5 mg by mouth 2 (two) times daily. 11/10/16  Yes Historical Provider, MD  JARDIANCE 25 MG TABS tablet Take 25 mg by mouth daily. 11/22/16  Yes Historical Provider, MD  Multiple Vitamin (MULTIVITAMIN WITH MINERALS) TABS tablet Take 1 tablet by mouth daily.   Yes Historical Provider, MD  pravastatin (PRAVACHOL) 40 MG tablet Take 40 mg by mouth daily. 11/18/16  Yes Historical Provider, MD  ranitidine (ZANTAC) 75 MG tablet Take 150 mg by mouth every evening.   Yes Historical Provider, MD    Inpatient Medications:  . aspirin  325 mg Oral Daily  . famotidine  20 mg Oral Daily  . insulin aspart  0-9 Units Subcutaneous TID WC  . pravastatin  40 mg Oral Daily   .  heparin 1,800 Units/hr (12/21/16 0815)    Allergies:  Allergies  Allergen Reactions  . Nicotine Rash    Nicotine patch only.    Social History   Social History  . Marital status: Married    Spouse name: N/A  . Number of children: N/A  . Years of education: N/A   Occupational History  . Not on file.   Social History Main Topics  . Smoking status: Current Every Day Smoker    Packs/day: 1.00  . Smokeless tobacco: Never Used     Comment: >30 years  . Alcohol use Yes     Comment: Very rarely  . Drug use: No  . Sexual activity: Not on file   Other Topics  Concern  . Not on file   Social History Narrative  . No narrative on file     Family History  Problem Relation Age of Onset  . CAD Father     died of massive MI at age 106, first MI several years earlier  . Lung disease Father      Review of Systems: No bleeding, LEE, orthopnea, PND. Pain was not worse with inspiration. All other systems reviewed and are otherwise negative except as noted above.  Labs:  Recent Labs  12/20/16 2327 12/21/16 0526  TROPONINI 0.47* 0.33*   Lab Results  Component Value Date   WBC 8.1 12/21/2016   HGB 15.6 12/21/2016   HCT 45.9 12/21/2016   MCV 95.6 12/21/2016   PLT 145 (L) 12/21/2016     Recent Labs Lab 12/20/16 1911  NA 136  K 3.7  CL 100*  CO2 27  BUN 21*  CREATININE 0.93  CALCIUM 9.0  GLUCOSE 148*   Radiology/Studies:  Dg Chest 2 View  Result Date: 12/20/2016 CLINICAL DATA:  Left-sided chest pain radiates into the shoulder. EXAM: CHEST  2 VIEW COMPARISON:  None. FINDINGS: The heart size and mediastinal contours are within normal limits. Both lungs are clear. The visualized skeletal structures are unremarkable. IMPRESSION: No active cardiopulmonary disease. Electronically Signed   By: Kennith Center M.D.   On: 12/20/2016 18:27    Wt Readings from Last 3 Encounters:  12/20/16 240 lb (108.9 kg)    EKG: NSR 90bpm with ventricular trigeminy, no acute ST-T changes  Physical Exam: Blood pressure 126/60, pulse 79, temperature 97.6 F (36.4 C), temperature source Oral, resp. rate 18, height 5\' 11"  (1.803 m), weight 240 lb (108.9 kg), SpO2 96 %. Body mass index is 33.47 kg/m. General: Well developed, well nourished, in no acute distress. Head: Normocephalic, atraumatic, sclera non-icteric, no xanthomas, nares are without discharge.  Neck: Negative for carotid bruits. JVD not elevated. Lungs: Clear bilaterally to auscultation without wheezes, rales, or rhonchi. Breathing is unlabored. Heart: RRR with S1 S2. No murmurs, rubs, or  gallops appreciated. Abdomen: Soft, non-tender, non-distended with normoactive bowel sounds. No hepatomegaly. No rebound/guarding. No obvious abdominal masses. Msk:  Strength and tone appear normal for age. Extremities: No clubbing or cyanosis. No edema.  Distal pedal pulses are 2+ and equal bilaterally. Neuro: Alert and oriented X 3. No facial asymmetry. No focal deficit. Moves all extremities spontaneously. Psych:  Responds to questions appropriately with a normal affect.     Assessment and Plan  33M with DM, hyperlipidemia, 30 years of tobacco abuse. obesity and family history of CAD (father died of MI age 81 with first MI several years earlier) - presented to [facility] with 2 episodes of left sided chest pain/burning over the last week.  1. Chest pain - mixed typical/atypical features. Although he's had some post-prandial discomfort, his cardiac risk factors of DM, HLD, family history and several decades of tobacco abuse put him at higher risk of this representing obstructive CAD. Troponin peak 0.47. EKG with frequent ectopy. I would favor definitive cath for assessment but will review with MD. Risks and benefits of cardiac catheterization have been discussed with the patient. These include bleeding, infection, kidney damage, stroke, heart attack, death. The patient understands these risks and is willing to proceed. Decrease ASA to 81mg  daily (rec'd 324mg  yesterday). Check baseline lipids/LFTs. Titrate statin. Continue heparin per pharmacy. Consider addition of beta blocker. Await echo. IM is checking d-dimer.  2. Frequent PVCs/bigeminy/trigeminy - asymptomatic. Await echo. Give KCl x1 for goal K of >4. Check Mg, wrote care order to page cardiology if 1.8 or less. If ectopy does not improve with lyte repletion or above workup of chest pain, may need to consider OP event monitor to assess burden given frequency.  3. Hyperlipidemia - titrate statin to high intensity given DM and elevated  troponin.  4. Tobacco abuse - counseled on importance of cessation.   5. Diabetes mellitus - per IM.  Signed, [name] PA-C 12/21/2016, 8:23 AM Pager: [phone]  The patient was seen and examined, and I agree with the history, physical exam, assessment and plan as documented above, with modifications as noted below. I have also personally reviewed all relevant documentation, old records, labs, and both radiographic and cardiovascular studies. I have also independently interpreted old and new ECG's. 56 yr old male with multiple CV risk factors as detailed above admitted with chest pain and mild troponin elevation. Last experienced symptoms on Saturday. Last week was the first time he experienced chest pain. Has also had progressive fatigue which he attributes to aging.  ECG which I personally interpreted showed sinus rhythm with frequent PVC's in a pattern of trigeminy. K 3.7.  Given his multiple risk factors accompanied by ventricular ectopy, I would favor coronary angiography. 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  He will speak about it with his wife.  For the time being, continue current medical therapy which includes IV heparin. I will start metoprolol 25 mg bid. Echocardiogram about to be performed, which I will review.  Prentice Docker, MD, [facility]  12/21/2016 9:07 AM  ADDENDUM: Echocardiogram showed severely reduced LV systolic function, LVEF 30%, with wall motion abnormalities suggestive of ischemic heart disease.  I will switch metoprolol tartrate to carvedilol 3.125 mg bid and start low dose lisinopril as well.

## 2016-12-21 NOTE — Progress Notes (Signed)
*  PRELIMINARY RESULTS* Echocardiogram 2D Echocardiogram with definity has been performed.  Brad Olson 12/21/2016, 9:47 AM

## 2016-12-21 NOTE — Progress Notes (Signed)
CRITICAL VALUE ALERT  Critical value received:  Troponin 0.47  Date of notification:  12/21/2016  Time of notification:  0051  Critical value read back:Yes.    Nurse who received alert:  Vivi Ferns, RN  MD notified (1st page):  Dr. Sharl Ma  Time of first page:  0056  MD notified (2nd page):  Time of second page:  Responding MD:  Dr. Sharl Ma  Time MD responded:  [email or web address]  MD aware. No orders at this time. Will continue to monitor.

## 2016-12-21 NOTE — Progress Notes (Signed)
Discussed echo results with patient - reaffirmed recommendation for cath given concern for ischemia. He is agreeable to proceeding. Discussed with cath lab re: scheduling, have put on schedule for tomorrow at 11:30am with Dr. Tresa Endo. 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 Internal medicine made aware we will take on our service. Orders written for cath. Will only give limited IV fluids in a.m. per review with MD given low EF. Will allow him to eat today. Patient remains pain free at this time. Dr. Purvis Sheffield also wrote for BB/ACEI. Also informed nurse of plan. Dayna Dunn PA-C

## 2016-12-21 NOTE — Progress Notes (Signed)
ANTICOAGULATION CONSULT NOTE   Pharmacy Consult for heparin Indication: chest pain/ACS  Allergies  Allergen Reactions  . Nicotine Rash    Nicotine patch only.   Patient Measurements: Height: 5\' 11"  (180.3 cm) Weight: 240 lb (108.9 kg) IBW/kg (Calculated) : 75.3 Heparin Dosing Weight: 98.7 kg  Vital Signs: BP: 142/81 (04/10 1013) Pulse Rate: 76 (04/10 1013)  Labs:  Recent Labs  12/20/16 1911 12/20/16 2327 12/21/16 0526 12/21/16 1409  HGB 16.1  --  15.6  --   HCT 46.3  --  45.9  --   PLT 152  --  145*  --   HEPARINUNFRC  --   --  <0.10* 0.43  CREATININE 0.93  --   --   --   TROPONINI  --  0.47* 0.33*  --     Estimated Creatinine Clearance: 113.9 mL/min (by C-G formula based on SCr of 0.93 mg/dL).  Medical History: Past Medical History:  Diagnosis Date  . Diabetes mellitus without complication (HCC)   . Hyperlipidemia   . Obesity   . Tobacco abuse    Medications:  See medication history  Assessment: 56 yo man to start heparin for CP.  He was not on anticoagulation PTA.  Initial heparin level was below goal, Heparin increased >> heparin level now therapeutic.   Goal of Therapy:  Heparin level 0.3-0.7 units/ml Monitor platelets by anticoagulation protocol: Yes   Plan:  Continue Heparin drip at 1800 units/hr Check heparin level daily while on heparin CBC daily Monitor for bleeding complications  Valrie Hart A 12/21/2016,3:58 PM

## 2016-12-21 NOTE — Progress Notes (Signed)
Pt and family informed of pt's transfer to 2W,  29C. [facility] has been called. Report called and given to [facility]. All questions were answered and no further questions at this time. Pt in stable condition and in no acute distress at this time.

## 2016-12-22 ENCOUNTER — Inpatient Hospital Stay (HOSPITAL_COMMUNITY): Payer: BLUE CROSS/BLUE SHIELD

## 2016-12-22 ENCOUNTER — Inpatient Hospital Stay (HOSPITAL_COMMUNITY): Payer: BLUE CROSS/BLUE SHIELD | Admitting: Certified Registered Nurse Anesthetist

## 2016-12-22 ENCOUNTER — Encounter (HOSPITAL_COMMUNITY)
Admission: EM | Disposition: A | Discharge status: Home / Self Care | Payer: Self-pay | Source: Home / Self Care | Attending: Cardiothoracic Surgery

## 2016-12-22 DIAGNOSIS — Z01810 Encounter for preprocedural cardiovascular examination: Secondary | ICD-10-CM

## 2016-12-22 DIAGNOSIS — I420 Dilated cardiomyopathy: Secondary | ICD-10-CM

## 2016-12-22 DIAGNOSIS — Z951 Presence of aortocoronary bypass graft: Secondary | ICD-10-CM

## 2016-12-22 DIAGNOSIS — I255 Ischemic cardiomyopathy: Secondary | ICD-10-CM

## 2016-12-22 DIAGNOSIS — E785 Hyperlipidemia, unspecified: Secondary | ICD-10-CM

## 2016-12-22 DIAGNOSIS — I25110 Atherosclerotic heart disease of native coronary artery with unstable angina pectoris: Secondary | ICD-10-CM

## 2016-12-22 DIAGNOSIS — I214 Non-ST elevation (NSTEMI) myocardial infarction: Secondary | ICD-10-CM

## 2016-12-22 HISTORY — PX: ABDOMINAL AORTOGRAM: CATH118222

## 2016-12-22 HISTORY — PX: CORONARY ARTERY BYPASS GRAFT: SHX141

## 2016-12-22 HISTORY — PX: IABP INSERTION: CATH118242

## 2016-12-22 HISTORY — PX: LEFT HEART CATH AND CORONARY ANGIOGRAPHY: CATH118249

## 2016-12-22 HISTORY — PX: TEE WITHOUT CARDIOVERSION: SHX5443

## 2016-12-22 LAB — VAS US CAROTID
LCCADSYS: 60 cm/s
LEFT ECA DIAS: -1 cm/s
LICADDIAS: -23 cm/s
LICAPDIAS: -68 cm/s
Left CCA dist dias: 8 cm/s
Left CCA prox dias: 9 cm/s
Left CCA prox sys: 101 cm/s
Left ICA dist sys: -92 cm/s
Left ICA prox sys: -452 cm/s
RCCADSYS: -133 cm/s
RCCAPDIAS: 0 cm/s
RIGHT ECA DIAS: -18 cm/s
RIGHT VERTEBRAL DIAS: 3 cm/s
Right CCA prox sys: 70 cm/s

## 2016-12-22 LAB — POCT I-STAT, CHEM 8
BUN: 25 mg/dL — ABNORMAL HIGH (ref 6–20)
BUN: 25 mg/dL — ABNORMAL HIGH (ref 6–20)
BUN: 22 mg/dL — ABNORMAL HIGH (ref 6–20)
BUN: 23 mg/dL — AB (ref 6–20)
BUN: 23 mg/dL — AB (ref 6–20)
BUN: 23 mg/dL — ABNORMAL HIGH (ref 6–20)
BUN: 24 mg/dL — ABNORMAL HIGH (ref 6–20)
BUN: 24 mg/dL — ABNORMAL HIGH (ref 6–20)
BUN: 25 mg/dL — ABNORMAL HIGH (ref 6–20)
BUN: 23 mg/dL — ABNORMAL HIGH (ref 6–20)
CALCIUM ION: 1.2 mmol/L (ref 1.15–1.40)
CALCIUM ION: 1.05 mmol/L — AB (ref 1.15–1.40)
CALCIUM ION: 1.05 mmol/L — AB (ref 1.15–1.40)
CALCIUM ION: 1.07 mmol/L — AB (ref 1.15–1.40)
CALCIUM ION: 1.45 mmol/L — AB (ref 1.15–1.40)
CHLORIDE: 106 mmol/L (ref 101–111)
CHLORIDE: 102 mmol/L (ref 101–111)
CHLORIDE: 102 mmol/L (ref 101–111)
CHLORIDE: 103 mmol/L (ref 101–111)
CHLORIDE: 102 mmol/L (ref 101–111)
CHLORIDE: 105 mmol/L (ref 101–111)
CREATININE: 0.9 mg/dL (ref 0.61–1.24)
CREATININE: 0.9 mg/dL (ref 0.61–1.24)
CREATININE: 1 mg/dL (ref 0.61–1.24)
Calcium, Ion: 1.15 mmol/L (ref 1.15–1.40)
Calcium, Ion: 1.02 mmol/L — ABNORMAL LOW (ref 1.15–1.40)
Calcium, Ion: 1.05 mmol/L — ABNORMAL LOW (ref 1.15–1.40)
Calcium, Ion: 1.05 mmol/L — ABNORMAL LOW (ref 1.15–1.40)
Calcium, Ion: 1.22 mmol/L (ref 1.15–1.40)
Chloride: 100 mmol/L — ABNORMAL LOW (ref 101–111)
Chloride: 102 mmol/L (ref 101–111)
Chloride: 102 mmol/L (ref 101–111)
Chloride: 104 mmol/L (ref 101–111)
Creatinine, Ser: 0.9 mg/dL (ref 0.61–1.24)
Creatinine, Ser: 1 mg/dL (ref 0.61–1.24)
Creatinine, Ser: 0.9 mg/dL (ref 0.61–1.24)
Creatinine, Ser: 0.9 mg/dL (ref 0.61–1.24)
Creatinine, Ser: 0.9 mg/dL (ref 0.61–1.24)
Creatinine, Ser: 1 mg/dL (ref 0.61–1.24)
Creatinine, Ser: 1 mg/dL (ref 0.61–1.24)
GLUCOSE: 29 mg/dL — AB (ref 65–99)
GLUCOSE: 51 mg/dL — AB (ref 65–99)
GLUCOSE: 119 mg/dL — AB (ref 65–99)
Glucose, Bld: 143 mg/dL — ABNORMAL HIGH (ref 65–99)
Glucose, Bld: 146 mg/dL — ABNORMAL HIGH (ref 65–99)
Glucose, Bld: 27 mg/dL — CL (ref 65–99)
Glucose, Bld: 90 mg/dL (ref 65–99)
Glucose, Bld: 140 mg/dL — ABNORMAL HIGH (ref 65–99)
Glucose, Bld: 95 mg/dL (ref 65–99)
Glucose, Bld: 94 mg/dL (ref 65–99)
HCT: 42 % (ref 39.0–52.0)
HCT: 30 % — ABNORMAL LOW (ref 39.0–52.0)
HCT: 32 % — ABNORMAL LOW (ref 39.0–52.0)
HEMATOCRIT: 39 % (ref 39.0–52.0)
HEMATOCRIT: 31 % — AB (ref 39.0–52.0)
HEMATOCRIT: 31 % — AB (ref 39.0–52.0)
HEMATOCRIT: 34 % — AB (ref 39.0–52.0)
HEMATOCRIT: 27 % — AB (ref 39.0–52.0)
HEMATOCRIT: 30 % — AB (ref 39.0–52.0)
HEMATOCRIT: 33 % — AB (ref 39.0–52.0)
HEMOGLOBIN: 13.3 g/dL (ref 13.0–17.0)
HEMOGLOBIN: 14.3 g/dL (ref 13.0–17.0)
HEMOGLOBIN: 11.6 g/dL — AB (ref 13.0–17.0)
HEMOGLOBIN: 9.2 g/dL — AB (ref 13.0–17.0)
HEMOGLOBIN: 10.2 g/dL — AB (ref 13.0–17.0)
HEMOGLOBIN: 10.9 g/dL — AB (ref 13.0–17.0)
Hemoglobin: 10.5 g/dL — ABNORMAL LOW (ref 13.0–17.0)
Hemoglobin: 10.5 g/dL — ABNORMAL LOW (ref 13.0–17.0)
Hemoglobin: 10.2 g/dL — ABNORMAL LOW (ref 13.0–17.0)
Hemoglobin: 11.2 g/dL — ABNORMAL LOW (ref 13.0–17.0)
POTASSIUM: 4.3 mmol/L (ref 3.5–5.1)
POTASSIUM: 3.3 mmol/L — AB (ref 3.5–5.1)
POTASSIUM: 3.4 mmol/L — AB (ref 3.5–5.1)
POTASSIUM: 3.5 mmol/L (ref 3.5–5.1)
POTASSIUM: 3.7 mmol/L (ref 3.5–5.1)
POTASSIUM: 3.8 mmol/L (ref 3.5–5.1)
POTASSIUM: 4.5 mmol/L (ref 3.5–5.1)
POTASSIUM: 4.1 mmol/L (ref 3.5–5.1)
Potassium: 3.4 mmol/L — ABNORMAL LOW (ref 3.5–5.1)
Potassium: 4.6 mmol/L (ref 3.5–5.1)
SODIUM: 137 mmol/L (ref 135–145)
SODIUM: 140 mmol/L (ref 135–145)
SODIUM: 138 mmol/L (ref 135–145)
SODIUM: 140 mmol/L (ref 135–145)
SODIUM: 140 mmol/L (ref 135–145)
SODIUM: 136 mmol/L (ref 135–145)
SODIUM: 139 mmol/L (ref 135–145)
SODIUM: 139 mmol/L (ref 135–145)
Sodium: 140 mmol/L (ref 135–145)
Sodium: 139 mmol/L (ref 135–145)
TCO2: 30 mmol/L (ref 0–100)
TCO2: 28 mmol/L (ref 0–100)
TCO2: 28 mmol/L (ref 0–100)
TCO2: 28 mmol/L (ref 0–100)
TCO2: 28 mmol/L (ref 0–100)
TCO2: 28 mmol/L (ref 0–100)
TCO2: 28 mmol/L (ref 0–100)
TCO2: 28 mmol/L (ref 0–100)
TCO2: 28 mmol/L (ref 0–100)
TCO2: 29 mmol/L (ref 0–100)

## 2016-12-22 LAB — CBC
HCT: 42.1 % (ref 39.0–52.0)
HEMATOCRIT: 37.8 % — AB (ref 39.0–52.0)
HEMOGLOBIN: 12.4 g/dL — AB (ref 13.0–17.0)
Hemoglobin: 14.4 g/dL (ref 13.0–17.0)
MCH: 32.4 pg (ref 26.0–34.0)
MCH: 31.3 pg (ref 26.0–34.0)
MCHC: 34.2 g/dL (ref 30.0–36.0)
MCHC: 32.8 g/dL (ref 30.0–36.0)
MCV: 94.6 fL (ref 78.0–100.0)
MCV: 95.5 fL (ref 78.0–100.0)
Platelets: 138 10*3/uL — ABNORMAL LOW (ref 150–400)
Platelets: 113 10*3/uL — ABNORMAL LOW (ref 150–400)
RBC: 4.45 MIL/uL (ref 4.22–5.81)
RBC: 3.96 MIL/uL — ABNORMAL LOW (ref 4.22–5.81)
RDW: 12.9 % (ref 11.5–15.5)
RDW: 13 % (ref 11.5–15.5)
WBC: 7.4 10*3/uL (ref 4.0–10.5)
WBC: 13.9 10*3/uL — ABNORMAL HIGH (ref 4.0–10.5)

## 2016-12-22 LAB — PLATELET COUNT: Platelets: 126 10*3/uL — ABNORMAL LOW (ref 150–400)

## 2016-12-22 LAB — POCT I-STAT 3, ART BLOOD GAS (G3+)
ACID-BASE DEFICIT: 2 mmol/L (ref 0.0–2.0)
Acid-Base Excess: 2 mmol/L (ref 0.0–2.0)
BICARBONATE: 26.3 mmol/L (ref 20.0–28.0)
Bicarbonate: 27.2 mmol/L (ref 20.0–28.0)
O2 SAT: 94 %
O2 Saturation: 100 %
PCO2 ART: 45.1 mmHg (ref 32.0–48.0)
PCO2 ART: 63.9 mmHg — AB (ref 32.0–48.0)
PH ART: 7.389 (ref 7.350–7.450)
PO2 ART: 84 mmHg (ref 83.0–108.0)
Patient temperature: 36.6
TCO2: 29 mmol/L (ref 0–100)
TCO2: 28 mmol/L (ref 0–100)
pH, Arterial: 7.221 — ABNORMAL LOW (ref 7.350–7.450)
pO2, Arterial: 276 mmHg — ABNORMAL HIGH (ref 83.0–108.0)

## 2016-12-22 LAB — BASIC METABOLIC PANEL
Anion gap: 8 (ref 5–15)
BUN: 24 mg/dL — AB (ref 6–20)
CHLORIDE: 104 mmol/L (ref 101–111)
CO2: 26 mmol/L (ref 22–32)
CREATININE: 1.01 mg/dL (ref 0.61–1.24)
Calcium: 8.7 mg/dL — ABNORMAL LOW (ref 8.9–10.3)
GFR calc non Af Amer: >60 mL/min (ref >60)
Glucose, Bld: 164 mg/dL — ABNORMAL HIGH (ref 65–99)
POTASSIUM: 4.5 mmol/L (ref 3.5–5.1)
Sodium: 138 mmol/L (ref 135–145)

## 2016-12-22 LAB — GLUCOSE, CAPILLARY
GLUCOSE-CAPILLARY: 122 mg/dL — AB (ref 65–99)
Glucose-Capillary: 166 mg/dL — ABNORMAL HIGH (ref 65–99)
Glucose-Capillary: 138 mg/dL — ABNORMAL HIGH (ref 65–99)
Glucose-Capillary: 159 mg/dL — ABNORMAL HIGH (ref 65–99)

## 2016-12-22 LAB — POCT I-STAT 4, (NA,K, GLUC, HGB,HCT)
Glucose, Bld: 134 mg/dL — ABNORMAL HIGH (ref 65–99)
HEMATOCRIT: 35 % — AB (ref 39.0–52.0)
HEMOGLOBIN: 11.9 g/dL — AB (ref 13.0–17.0)
Potassium: 4.4 mmol/L (ref 3.5–5.1)
SODIUM: 139 mmol/L (ref 135–145)

## 2016-12-22 LAB — HEMOGLOBIN A1C
HEMOGLOBIN A1C: 8.5 % — AB (ref 4.8–5.6)
Hgb A1c MFr Bld: 8.5 % — ABNORMAL HIGH (ref 4.8–5.6)
MEAN PLASMA GLUCOSE: 197 mg/dL
MEAN PLASMA GLUCOSE: 197 mg/dL

## 2016-12-22 LAB — PROTIME-INR
INR: 1.02
INR: 1.27
PROTHROMBIN TIME: 13.4 s (ref 11.4–15.2)
Prothrombin Time: 16 seconds — ABNORMAL HIGH (ref 11.4–15.2)

## 2016-12-22 LAB — MRSA PCR SCREENING: MRSA BY PCR: NEGATIVE

## 2016-12-22 LAB — POCT ACTIVATED CLOTTING TIME: ACTIVATED CLOTTING TIME: 202 s

## 2016-12-22 LAB — HEPARIN LEVEL (UNFRACTIONATED): HEPARIN UNFRACTIONATED: 0.54 [IU]/mL (ref 0.30–0.70)

## 2016-12-22 LAB — ABO/RH: ABO/RH(D): O POS

## 2016-12-22 LAB — APTT: aPTT: 29 seconds (ref 24–36)

## 2016-12-22 LAB — HIV ANTIBODY (ROUTINE TESTING W REFLEX): HIV Screen 4th Generation wRfx: NONREACTIVE

## 2016-12-22 LAB — HEMOGLOBIN AND HEMATOCRIT, BLOOD
HCT: 32.5 % — ABNORMAL LOW (ref 39.0–52.0)
Hemoglobin: 10.9 g/dL — ABNORMAL LOW (ref 13.0–17.0)

## 2016-12-22 LAB — PREPARE RBC (CROSSMATCH)

## 2016-12-22 SURGERY — CORONARY ARTERY BYPASS GRAFTING (CABG)
Anesthesia: General | Site: Chest

## 2016-12-22 SURGERY — LEFT HEART CATH AND CORONARY ANGIOGRAPHY
Anesthesia: LOCAL

## 2016-12-22 MED ORDER — ACETAMINOPHEN 500 MG PO TABS
1000.0000 mg | ORAL_TABLET | Freq: Four times a day (QID) | ORAL | Status: AC
  Administered 2016-12-23 – 2016-12-27 (×17): 1000 mg via ORAL
  Filled 2016-12-22 (×16): qty 2

## 2016-12-22 MED ORDER — DOCUSATE SODIUM 100 MG PO CAPS
200.0000 mg | ORAL_CAPSULE | Freq: Every day | ORAL | Status: DC
  Administered 2016-12-23 – 2016-12-29 (×7): 200 mg via ORAL
  Filled 2016-12-22 (×7): qty 2

## 2016-12-22 MED ORDER — VANCOMYCIN HCL IN DEXTROSE 1-5 GM/200ML-% IV SOLN
1000.0000 mg | Freq: Once | INTRAVENOUS | Status: AC
  Administered 2016-12-23: 1000 mg via INTRAVENOUS
  Filled 2016-12-22: qty 200

## 2016-12-22 MED ORDER — MIDAZOLAM HCL 5 MG/5ML IJ SOLN
INTRAMUSCULAR | Status: DC | PRN
  Administered 2016-12-22: 5 mg via INTRAVENOUS
  Administered 2016-12-22: 2 mg via INTRAVENOUS
  Administered 2016-12-22: 3 mg via INTRAVENOUS

## 2016-12-22 MED ORDER — MIDAZOLAM HCL 2 MG/2ML IJ SOLN
INTRAMUSCULAR | Status: DC | PRN
  Administered 2016-12-22: 1 mg via INTRAVENOUS
  Administered 2016-12-22: 2 mg via INTRAVENOUS

## 2016-12-22 MED ORDER — VERAPAMIL HCL 2.5 MG/ML IV SOLN
INTRAVENOUS | Status: AC
  Filled 2016-12-22: qty 2

## 2016-12-22 MED ORDER — IOPAMIDOL (ISOVUE-370) INJECTION 76%
INTRAVENOUS | Status: AC
  Filled 2016-12-22: qty 100

## 2016-12-22 MED ORDER — LACTATED RINGERS IV SOLN
INTRAVENOUS | Status: DC | PRN
  Administered 2016-12-22: 14:00:00 via INTRAVENOUS

## 2016-12-22 MED ORDER — ASPIRIN EC 325 MG PO TBEC
325.0000 mg | DELAYED_RELEASE_TABLET | Freq: Every day | ORAL | Status: DC
  Administered 2016-12-23 – 2016-12-29 (×7): 325 mg via ORAL
  Filled 2016-12-22 (×7): qty 1

## 2016-12-22 MED ORDER — MIDAZOLAM HCL 2 MG/2ML IJ SOLN
INTRAMUSCULAR | Status: AC
  Filled 2016-12-22: qty 2

## 2016-12-22 MED ORDER — DOPAMINE-DEXTROSE 3.2-5 MG/ML-% IV SOLN
0.0000 ug/kg/min | INTRAVENOUS | Status: AC
  Administered 2016-12-22: 3 ug/kg/min via INTRAVENOUS
  Filled 2016-12-22: qty 250

## 2016-12-22 MED ORDER — LACTATED RINGERS IV SOLN: 500.0000 mL | Freq: Once | INTRAVENOUS | Status: DC | PRN

## 2016-12-22 MED ORDER — SODIUM CHLORIDE 0.9% FLUSH
3.0000 mL | Freq: Two times a day (BID) | INTRAVENOUS | Status: DC
  Administered 2016-12-23 – 2016-12-27 (×9): 3 mL via INTRAVENOUS

## 2016-12-22 MED ORDER — TRAMADOL HCL 50 MG PO TABS
50.0000 mg | ORAL_TABLET | ORAL | Status: DC | PRN
  Administered 2016-12-23 – 2016-12-28 (×5): 100 mg via ORAL
  Filled 2016-12-22 (×6): qty 2

## 2016-12-22 MED ORDER — SODIUM CHLORIDE 0.45 % IV SOLN
INTRAVENOUS | Status: DC | PRN
  Administered 2016-12-24: 10 mL/h via INTRAVENOUS

## 2016-12-22 MED ORDER — ROCURONIUM BROMIDE 50 MG/5ML IV SOSY
PREFILLED_SYRINGE | INTRAVENOUS | Status: AC
  Filled 2016-12-22: qty 10

## 2016-12-22 MED ORDER — MAGNESIUM SULFATE 50 % IJ SOLN
40.0000 meq | INTRAMUSCULAR | Status: DC
  Filled 2016-12-22: qty 10

## 2016-12-22 MED ORDER — DEXMEDETOMIDINE HCL IN NACL 400 MCG/100ML IV SOLN
0.1000 ug/kg/h | INTRAVENOUS | Status: AC
Start: 2016-12-22 — End: 2016-12-22
  Administered 2016-12-22: .2 ug/kg/h via INTRAVENOUS
  Filled 2016-12-22: qty 100

## 2016-12-22 MED ORDER — MIDAZOLAM HCL 2 MG/2ML IJ SOLN
2.0000 mg | INTRAMUSCULAR | Status: DC | PRN
  Administered 2016-12-22 – 2016-12-23 (×3): 2 mg via INTRAVENOUS
  Filled 2016-12-22 (×3): qty 2

## 2016-12-22 MED ORDER — TRANEXAMIC ACID 1000 MG/10ML IV SOLN
1.5000 mg/kg/h | INTRAVENOUS | Status: AC
  Administered 2016-12-22: 1.5 mg/kg/h via INTRAVENOUS
  Filled 2016-12-22: qty 25

## 2016-12-22 MED ORDER — LACTATED RINGERS IV SOLN
INTRAVENOUS | Status: DC | PRN
  Administered 2016-12-22 (×3): via INTRAVENOUS

## 2016-12-22 MED ORDER — VERAPAMIL HCL 2.5 MG/ML IV SOLN
INTRAVENOUS | Status: DC | PRN
  Administered 2016-12-22: 10 mL via INTRA_ARTERIAL

## 2016-12-22 MED ORDER — VANCOMYCIN HCL 10 G IV SOLR
1500.0000 mg | INTRAVENOUS | Status: AC
  Administered 2016-12-22: 1500 mg via INTRAVENOUS
  Filled 2016-12-22: qty 1500

## 2016-12-22 MED ORDER — MIDAZOLAM HCL 10 MG/2ML IJ SOLN
INTRAMUSCULAR | Status: AC
  Filled 2016-12-22: qty 2

## 2016-12-22 MED ORDER — METOPROLOL TARTRATE 5 MG/5ML IV SOLN
2.5000 mg | INTRAVENOUS | Status: DC | PRN
Start: 2016-12-22 — End: 2016-12-29

## 2016-12-22 MED ORDER — LIDOCAINE HCL (PF) 1 % IJ SOLN
INTRAMUSCULAR | Status: DC | PRN
  Administered 2016-12-22: 2 mL
  Administered 2016-12-22: 15 mL

## 2016-12-22 MED ORDER — PHENYLEPHRINE HCL 10 MG/ML IJ SOLN
INTRAVENOUS | Status: DC | PRN
  Administered 2016-12-22: 50 ug/min via INTRAVENOUS

## 2016-12-22 MED ORDER — SODIUM CHLORIDE 0.9 % IV SOLN
INTRAVENOUS | Status: AC
  Filled 2016-12-22: qty 2.5

## 2016-12-22 MED ORDER — SODIUM CHLORIDE 0.9 % IJ SOLN
OROMUCOSAL | Status: DC | PRN
  Administered 2016-12-22 (×3): 4 mL via TOPICAL

## 2016-12-22 MED ORDER — HEPARIN SODIUM (PORCINE) 1000 UNIT/ML IJ SOLN
INTRAMUSCULAR | Status: AC
  Filled 2016-12-22: qty 1

## 2016-12-22 MED ORDER — HEPARIN (PORCINE) IN NACL 100-0.45 UNIT/ML-% IJ SOLN
INTRAMUSCULAR | Status: DC | PRN
  Administered 2016-12-22: 1200 [IU]/h via INTRAVENOUS

## 2016-12-22 MED ORDER — CHLORHEXIDINE GLUCONATE 0.12% ORAL RINSE (MEDLINE KIT)
15.0000 mL | Freq: Two times a day (BID) | OROMUCOSAL | Status: DC
  Administered 2016-12-23 (×2): 15 mL via OROMUCOSAL

## 2016-12-22 MED ORDER — DEXTROSE 10 % IV SOLN
INTRAVENOUS | Status: DC
  Filled 2016-12-22: qty 1000

## 2016-12-22 MED ORDER — ARTIFICIAL TEARS OP OINT
TOPICAL_OINTMENT | OPHTHALMIC | Status: DC | PRN
  Administered 2016-12-22: 1 via OPHTHALMIC

## 2016-12-22 MED ORDER — ALBUMIN HUMAN 5 % IV SOLN
250.0000 mL | INTRAVENOUS | Status: AC | PRN
  Administered 2016-12-22: 250 mL via INTRAVENOUS

## 2016-12-22 MED ORDER — METOPROLOL TARTRATE 25 MG/10 ML ORAL SUSPENSION: 12.5000 mg | Freq: Two times a day (BID) | ORAL | Status: DC

## 2016-12-22 MED ORDER — DEXTROSE 5 % IV SOLN
1.5000 g | Freq: Two times a day (BID) | INTRAVENOUS | Status: AC
  Administered 2016-12-23 – 2016-12-24 (×4): 1.5 g via INTRAVENOUS
  Filled 2016-12-22 (×4): qty 1.5

## 2016-12-22 MED ORDER — SODIUM CHLORIDE 0.9 % IV SOLN
INTRAVENOUS | Status: DC | PRN
  Administered 2016-12-22: 1.001 mL/kg/h via INTRAVENOUS

## 2016-12-22 MED ORDER — FAMOTIDINE IN NACL 20-0.9 MG/50ML-% IV SOLN
20.0000 mg | Freq: Two times a day (BID) | INTRAVENOUS | Status: AC
  Administered 2016-12-22 – 2016-12-23 (×2): 20 mg via INTRAVENOUS
  Filled 2016-12-22: qty 50

## 2016-12-22 MED ORDER — SODIUM CHLORIDE 0.9% FLUSH: 3.0000 mL | INTRAVENOUS | Status: DC | PRN

## 2016-12-22 MED ORDER — ROCURONIUM BROMIDE 10 MG/ML (PF) SYRINGE
PREFILLED_SYRINGE | INTRAVENOUS | Status: DC | PRN
  Administered 2016-12-22 (×3): 50 mg via INTRAVENOUS
  Administered 2016-12-22: 100 mg via INTRAVENOUS

## 2016-12-22 MED ORDER — DEXTROSE 50 % IV SOLN
INTRAVENOUS | Status: DC | PRN
  Administered 2016-12-22: 50 mL via INTRAVENOUS
  Administered 2016-12-22 (×2): 25 mL via INTRAVENOUS
  Administered 2016-12-22: 50 mL via INTRAVENOUS

## 2016-12-22 MED ORDER — ONDANSETRON HCL 4 MG/2ML IJ SOLN: 4.0000 mg | Freq: Four times a day (QID) | INTRAMUSCULAR | Status: DC | PRN

## 2016-12-22 MED ORDER — SODIUM CHLORIDE 0.9 % IV SOLN
INTRAVENOUS | Status: DC
  Filled 2016-12-22: qty 30

## 2016-12-22 MED ORDER — HEMOSTATIC AGENTS (NO CHARGE) OPTIME
TOPICAL | Status: DC | PRN
  Administered 2016-12-22: 1 via TOPICAL

## 2016-12-22 MED ORDER — NITROGLYCERIN IN D5W 200-5 MCG/ML-% IV SOLN
2.0000 ug/min | INTRAVENOUS | Status: DC
Start: 2016-12-22 — End: 2016-12-22
  Filled 2016-12-22: qty 250

## 2016-12-22 MED ORDER — ACETAMINOPHEN 650 MG RE SUPP
650.0000 mg | Freq: Once | RECTAL | Status: AC
  Administered 2016-12-22: 650 mg via RECTAL

## 2016-12-22 MED ORDER — BISACODYL 5 MG PO TBEC
10.0000 mg | DELAYED_RELEASE_TABLET | Freq: Every day | ORAL | Status: DC
  Administered 2016-12-23 – 2016-12-28 (×6): 10 mg via ORAL
  Filled 2016-12-22 (×6): qty 2

## 2016-12-22 MED ORDER — ACETAMINOPHEN 160 MG/5ML PO SOLN: 650.0000 mg | Freq: Once | ORAL | Status: AC

## 2016-12-22 MED ORDER — INSULIN REGULAR BOLUS VIA INFUSION
0.0000 [IU] | Freq: Three times a day (TID) | INTRAVENOUS | Status: DC
  Filled 2016-12-22: qty 10

## 2016-12-22 MED ORDER — SODIUM CHLORIDE 0.9 % IV SOLN: 250.0000 mL | INTRAVENOUS | Status: DC

## 2016-12-22 MED ORDER — MORPHINE SULFATE (PF) 4 MG/ML IV SOLN
1.0000 mg | INTRAVENOUS | Status: AC | PRN
Start: 2016-12-22 — End: 2016-12-23
  Administered 2016-12-23 (×2): 4 mg via INTRAVENOUS
  Filled 2016-12-22: qty 1

## 2016-12-22 MED ORDER — TRANEXAMIC ACID (OHS) BOLUS VIA INFUSION
15.0000 mg/kg | INTRAVENOUS | Status: AC
  Administered 2016-12-22: 1633.5 mg via INTRAVENOUS
  Filled 2016-12-22: qty 1634

## 2016-12-22 MED ORDER — DEXTROSE 50 % IV SOLN
INTRAVENOUS | Status: AC
  Filled 2016-12-22: qty 50

## 2016-12-22 MED ORDER — SODIUM CHLORIDE 0.9 % IV SOLN
30.0000 meq | Freq: Once | INTRAVENOUS | Status: AC
  Filled 2016-12-22: qty 15

## 2016-12-22 MED ORDER — FENTANYL CITRATE (PF) 100 MCG/2ML IJ SOLN
INTRAMUSCULAR | Status: DC | PRN
  Administered 2016-12-22 (×2): 50 ug via INTRAVENOUS

## 2016-12-22 MED ORDER — LACTATED RINGERS IV SOLN: INTRAVENOUS | Status: DC

## 2016-12-22 MED ORDER — ETOMIDATE 2 MG/ML IV SOLN
INTRAVENOUS | Status: DC | PRN
  Administered 2016-12-22: 10 mg via INTRAVENOUS

## 2016-12-22 MED ORDER — HEPARIN SODIUM (PORCINE) 1000 UNIT/ML IJ SOLN
INTRAMUSCULAR | Status: DC | PRN
  Administered 2016-12-22: 5500 [IU] via INTRAVENOUS

## 2016-12-22 MED ORDER — ACETAMINOPHEN 325 MG PO TABS: 650.0000 mg | ORAL_TABLET | ORAL | Status: DC | PRN

## 2016-12-22 MED ORDER — DEXTROSE 5 % IV SOLN
1.5000 g | INTRAVENOUS | Status: AC
  Administered 2016-12-22: .75 g via INTRAVENOUS
  Administered 2016-12-22: 1.5 g via INTRAVENOUS
  Filled 2016-12-22: qty 1.5

## 2016-12-22 MED ORDER — METOPROLOL TARTRATE 12.5 MG HALF TABLET
12.5000 mg | ORAL_TABLET | Freq: Two times a day (BID) | ORAL | Status: DC
  Administered 2016-12-23 – 2016-12-26 (×7): 12.5 mg via ORAL
  Filled 2016-12-22 (×6): qty 1

## 2016-12-22 MED ORDER — NOREPINEPHRINE BITARTRATE 1 MG/ML IV SOLN
0.0000 ug/min | INTRAVENOUS | Status: DC
  Filled 2016-12-22: qty 4

## 2016-12-22 MED ORDER — OXYCODONE HCL 5 MG PO TABS
5.0000 mg | ORAL_TABLET | ORAL | Status: DC | PRN
  Administered 2016-12-23: 10 mg via ORAL
  Administered 2016-12-24: 5 mg via ORAL
  Administered 2016-12-24 – 2016-12-27 (×16): 10 mg via ORAL
  Administered 2016-12-28: 5 mg via ORAL
  Administered 2016-12-28 – 2016-12-29 (×3): 10 mg via ORAL
  Filled 2016-12-22 (×22): qty 2

## 2016-12-22 MED ORDER — DEXTROSE 10 % IV SOLN
INTRAVENOUS | Status: DC | PRN
  Administered 2016-12-22: 19:00:00 via INTRAVENOUS

## 2016-12-22 MED ORDER — HEPARIN (PORCINE) IN NACL 2-0.9 UNIT/ML-% IJ SOLN
INTRAMUSCULAR | Status: AC
  Filled 2016-12-22: qty 1000

## 2016-12-22 MED ORDER — FENTANYL CITRATE (PF) 250 MCG/5ML IJ SOLN
INTRAMUSCULAR | Status: DC | PRN
  Administered 2016-12-22 (×5): 250 ug via INTRAVENOUS

## 2016-12-22 MED ORDER — SUCCINYLCHOLINE CHLORIDE 200 MG/10ML IV SOSY
PREFILLED_SYRINGE | INTRAVENOUS | Status: DC | PRN
  Administered 2016-12-22: 100 mg via INTRAVENOUS

## 2016-12-22 MED ORDER — PROPOFOL 10 MG/ML IV BOLUS
INTRAVENOUS | Status: AC
  Filled 2016-12-22: qty 20

## 2016-12-22 MED ORDER — TRANEXAMIC ACID (OHS) PUMP PRIME SOLUTION
2.0000 mg/kg | INTRAVENOUS | Status: DC
  Filled 2016-12-22: qty 2.18

## 2016-12-22 MED ORDER — SODIUM CHLORIDE 0.9 % IV SOLN
INTRAVENOUS | Status: AC
  Administered 2016-12-22: 1.7 [IU]/h via INTRAVENOUS
  Filled 2016-12-22: qty 2.5

## 2016-12-22 MED ORDER — PLASMA-LYTE 148 IV SOLN
INTRAVENOUS | Status: AC
  Administered 2016-12-22: 500 mL
  Filled 2016-12-22: qty 2.5

## 2016-12-22 MED ORDER — FENTANYL CITRATE (PF) 100 MCG/2ML IJ SOLN
INTRAMUSCULAR | Status: AC
  Filled 2016-12-22: qty 2

## 2016-12-22 MED ORDER — SODIUM CHLORIDE 0.9 % IV SOLN
30.0000 ug/min | INTRAVENOUS | Status: AC
  Administered 2016-12-22: 40 ug/min via INTRAVENOUS
  Filled 2016-12-22: qty 2

## 2016-12-22 MED ORDER — DEXTROSE 50 % IV SOLN
INTRAVENOUS | Status: AC
  Filled 2016-12-22: qty 100

## 2016-12-22 MED ORDER — ETOMIDATE 2 MG/ML IV SOLN
INTRAVENOUS | Status: AC
  Filled 2016-12-22: qty 10

## 2016-12-22 MED ORDER — FENTANYL CITRATE (PF) 250 MCG/5ML IJ SOLN
INTRAMUSCULAR | Status: AC
  Filled 2016-12-22: qty 25

## 2016-12-22 MED ORDER — MORPHINE SULFATE (PF) 4 MG/ML IV SOLN
2.0000 mg | INTRAVENOUS | Status: DC | PRN
  Administered 2016-12-24: 4 mg via INTRAVENOUS
  Administered 2016-12-24: 2 mg via INTRAVENOUS
  Filled 2016-12-22 (×3): qty 1

## 2016-12-22 MED ORDER — ALBUMIN HUMAN 5 % IV SOLN
INTRAVENOUS | Status: DC | PRN
  Administered 2016-12-22 (×2): via INTRAVENOUS

## 2016-12-22 MED ORDER — DOPAMINE-DEXTROSE 3.2-5 MG/ML-% IV SOLN: 0.0000 ug/kg/min | INTRAVENOUS | Status: DC

## 2016-12-22 MED ORDER — PROTAMINE SULFATE 10 MG/ML IV SOLN
INTRAVENOUS | Status: DC | PRN
  Administered 2016-12-22: 300 mg via INTRAVENOUS

## 2016-12-22 MED ORDER — MAGNESIUM SULFATE 4 GM/100ML IV SOLN
4.0000 g | Freq: Once | INTRAVENOUS | Status: AC
  Administered 2016-12-22: 4 g via INTRAVENOUS
  Filled 2016-12-22: qty 100

## 2016-12-22 MED ORDER — IOPAMIDOL (ISOVUE-370) INJECTION 76%
INTRAVENOUS | Status: DC | PRN
  Administered 2016-12-22: 140 mL via INTRA_ARTERIAL

## 2016-12-22 MED ORDER — ORAL CARE MOUTH RINSE
15.0000 mL | Freq: Four times a day (QID) | OROMUCOSAL | Status: DC
  Administered 2016-12-23: 15 mL via OROMUCOSAL

## 2016-12-22 MED ORDER — HEPARIN (PORCINE) IN NACL 2-0.9 UNIT/ML-% IJ SOLN
INTRAMUSCULAR | Status: DC | PRN
  Administered 2016-12-22: 2000 mL

## 2016-12-22 MED ORDER — SODIUM CHLORIDE 0.9 % IV SOLN: 250.0000 mL | INTRAVENOUS | Status: DC | PRN

## 2016-12-22 MED ORDER — CALCIUM CHLORIDE 10 % IV SOLN
INTRAVENOUS | Status: DC | PRN
  Administered 2016-12-22 (×2): 0.5 g via INTRAVENOUS

## 2016-12-22 MED ORDER — HEPARIN SODIUM (PORCINE) 1000 UNIT/ML IJ SOLN
INTRAMUSCULAR | Status: DC | PRN
  Administered 2016-12-22: 36000 [IU] via INTRAVENOUS

## 2016-12-22 MED ORDER — SODIUM CHLORIDE 0.9 % IR SOLN
Status: DC | PRN
  Administered 2016-12-22: 6000 mL

## 2016-12-22 MED ORDER — ASPIRIN 81 MG PO CHEW: 324.0000 mg | CHEWABLE_TABLET | Freq: Every day | ORAL | Status: DC

## 2016-12-22 MED ORDER — SODIUM CHLORIDE 0.9 % IV SOLN
0.0000 ug/kg/h | INTRAVENOUS | Status: DC
  Administered 2016-12-22: 0.6 ug/kg/h via INTRAVENOUS
  Filled 2016-12-22: qty 2

## 2016-12-22 MED ORDER — SODIUM CHLORIDE 0.9 % IV SOLN: INTRAVENOUS | Status: DC

## 2016-12-22 MED ORDER — SODIUM CHLORIDE 0.9 % IV SOLN: INTRAVENOUS | Status: AC

## 2016-12-22 MED ORDER — MILRINONE LACTATE IN DEXTROSE 20-5 MG/100ML-% IV SOLN
0.1250 ug/kg/min | INTRAVENOUS | Status: DC
  Filled 2016-12-22: qty 100

## 2016-12-22 MED ORDER — CHLORHEXIDINE GLUCONATE 0.12 % MT SOLN: 15.0000 mL | OROMUCOSAL | Status: AC

## 2016-12-22 MED ORDER — LIDOCAINE HCL (PF) 1 % IJ SOLN
INTRAMUSCULAR | Status: AC
  Filled 2016-12-22: qty 30

## 2016-12-22 MED ORDER — NITROGLYCERIN IN D5W 200-5 MCG/ML-% IV SOLN: 0.0000 ug/min | INTRAVENOUS | Status: DC

## 2016-12-22 MED ORDER — SODIUM CHLORIDE 0.9 % IV SOLN
0.0000 ug/min | INTRAVENOUS | Status: DC
  Filled 2016-12-22: qty 2

## 2016-12-22 MED ORDER — POTASSIUM CHLORIDE 2 MEQ/ML IV SOLN
80.0000 meq | INTRAVENOUS | Status: DC
  Filled 2016-12-22: qty 40

## 2016-12-22 MED ORDER — SODIUM CHLORIDE 0.9 % IV SOLN
1.5000 mg/kg/h | INTRAVENOUS | Status: DC
  Filled 2016-12-22: qty 25

## 2016-12-22 MED ORDER — SODIUM CHLORIDE 0.9 % IV SOLN
INTRAVENOUS | Status: DC | PRN
Start: 2016-12-22 — End: 2016-12-22
  Administered 2016-12-22: 10 mL/h via INTRAVENOUS

## 2016-12-22 MED ORDER — ACETAMINOPHEN 160 MG/5ML PO SOLN
1000.0000 mg | Freq: Four times a day (QID) | ORAL | Status: AC
  Administered 2016-12-23 – 2016-12-25 (×3): 1000 mg
  Filled 2016-12-22: qty 40.6

## 2016-12-22 MED ORDER — DEXTROSE 50 % IV SOLN
INTRAVENOUS | Status: AC
Start: 2016-12-22 — End: 2016-12-22
  Filled 2016-12-22: qty 50

## 2016-12-22 MED ORDER — LACTATED RINGERS IV SOLN
INTRAVENOUS | Status: DC
  Administered 2016-12-23: 20 mL/h via INTRAVENOUS

## 2016-12-22 MED ORDER — BISACODYL 10 MG RE SUPP: 10.0000 mg | Freq: Every day | RECTAL | Status: DC

## 2016-12-22 MED ORDER — PANTOPRAZOLE SODIUM 40 MG PO TBEC
40.0000 mg | DELAYED_RELEASE_TABLET | Freq: Every day | ORAL | Status: DC
  Administered 2016-12-24 – 2016-12-29 (×6): 40 mg via ORAL
  Filled 2016-12-22 (×6): qty 1

## 2016-12-22 MED ORDER — DEXTROSE 5 % IV SOLN
750.0000 mg | INTRAVENOUS | Status: DC
  Filled 2016-12-22: qty 750

## 2016-12-22 MED ORDER — EPINEPHRINE PF 1 MG/ML IJ SOLN
0.0000 ug/min | INTRAVENOUS | Status: DC
  Filled 2016-12-22: qty 4

## 2016-12-22 MED ORDER — SODIUM CHLORIDE 0.9% FLUSH
3.0000 mL | Freq: Two times a day (BID) | INTRAVENOUS | Status: DC
  Administered 2016-12-23 – 2016-12-29 (×10): 3 mL via INTRAVENOUS

## 2016-12-22 MED FILL — Potassium Chloride Inj 2 mEq/ML: INTRAVENOUS | Qty: 40 | Status: AC

## 2016-12-22 MED FILL — Magnesium Sulfate Inj 50%: INTRAMUSCULAR | Qty: 2 | Status: AC

## 2016-12-22 MED FILL — Heparin Sodium (Porcine) Inj 1000 Unit/ML: INTRAMUSCULAR | Qty: 30 | Status: AC

## 2016-12-22 SURGICAL SUPPLY — 61 items
BAG DECANTER FOR FLEXI CONT (MISCELLANEOUS) ×3 IMPLANT
BANDAGE ACE 4X5 VEL STRL LF (GAUZE/BANDAGES/DRESSINGS) ×3 IMPLANT
BANDAGE ACE 6X5 VEL STRL LF (GAUZE/BANDAGES/DRESSINGS) ×3 IMPLANT
BLADE STERNUM SYSTEM 6 (BLADE) ×3 IMPLANT
BNDG GAUZE ELAST 4 BULKY (GAUZE/BANDAGES/DRESSINGS) ×3 IMPLANT
CANISTER SUCT 3000ML PPV (MISCELLANEOUS) ×3 IMPLANT
CATH CPB KIT GERHARDT (MISCELLANEOUS) ×3 IMPLANT
CATH THORACIC 28FR (CATHETERS) ×3 IMPLANT
CRADLE DONUT ADULT HEAD (MISCELLANEOUS) ×3 IMPLANT
DRAIN CHANNEL 28F RND 3/8 FF (WOUND CARE) ×3 IMPLANT
DRAPE CARDIOVASCULAR INCISE (DRAPES) ×1
DRAPE SLUSH/WARMER DISC (DRAPES) ×3 IMPLANT
DRAPE SRG 135X102X78XABS (DRAPES) ×2 IMPLANT
DRSG AQUACEL AG ADV 3.5X14 (GAUZE/BANDAGES/DRESSINGS) ×3 IMPLANT
ELECT BLADE 4.0 EZ CLEAN MEGAD (MISCELLANEOUS) ×3
ELECT REM PT RETURN 9FT ADLT (ELECTROSURGICAL) ×6
ELECTRODE BLDE 4.0 EZ CLN MEGD (MISCELLANEOUS) ×2 IMPLANT
ELECTRODE REM PT RTRN 9FT ADLT (ELECTROSURGICAL) ×4 IMPLANT
FELT TEFLON 1X6 (MISCELLANEOUS) ×6 IMPLANT
GAUZE SPONGE 4X4 12PLY STRL (GAUZE/BANDAGES/DRESSINGS) ×6 IMPLANT
GAUZE SPONGE 4X4 12PLY STRL LF (GAUZE/BANDAGES/DRESSINGS) ×6 IMPLANT
GLOVE BIO SURGEON STRL SZ 6.5 (GLOVE) ×21 IMPLANT
GLOVE BIOGEL PI IND STRL 6.5 (GLOVE) ×4 IMPLANT
GLOVE BIOGEL PI INDICATOR 6.5 (GLOVE) ×2
GOWN STRL REUS W/ TWL LRG LVL3 (GOWN DISPOSABLE) ×12 IMPLANT
GOWN STRL REUS W/TWL LRG LVL3 (GOWN DISPOSABLE) ×6
HEMOSTAT POWDER SURGIFOAM 1G (HEMOSTASIS) ×9 IMPLANT
HEMOSTAT SURGICEL 2X14 (HEMOSTASIS) ×3 IMPLANT
KIT BASIN OR (CUSTOM PROCEDURE TRAY) ×3 IMPLANT
KIT CATH SUCT 8FR (CATHETERS) ×3 IMPLANT
KIT ROOM TURNOVER OR (KITS) ×3 IMPLANT
KIT SUCTION CATH 14FR (SUCTIONS) ×6 IMPLANT
KIT VASOVIEW HEMOPRO VH 3000 (KITS) ×3 IMPLANT
LEAD PACING MYOCARDI (MISCELLANEOUS) ×3 IMPLANT
MARKER GRAFT CORONARY BYPASS (MISCELLANEOUS) ×9 IMPLANT
NS IRRIG 1000ML POUR BTL (IV SOLUTION) ×18 IMPLANT
PACK OPEN HEART (CUSTOM PROCEDURE TRAY) ×3 IMPLANT
PAD ARMBOARD 7.5X6 YLW CONV (MISCELLANEOUS) ×6 IMPLANT
PAD ELECT DEFIB RADIOL ZOLL (MISCELLANEOUS) ×3 IMPLANT
PENCIL BUTTON HOLSTER BLD 10FT (ELECTRODE) ×3 IMPLANT
SET CARDIOPLEGIA MPS 5001102 (MISCELLANEOUS) ×3 IMPLANT
SUT BONE WAX W31G (SUTURE) ×3 IMPLANT
SUT PROLENE 3 0 SH1 36 (SUTURE) ×6 IMPLANT
SUT PROLENE 4 0 TF (SUTURE) ×6 IMPLANT
SUT PROLENE 6 0 CC (SUTURE) ×12 IMPLANT
SUT PROLENE 7 0 BV1 MDA (SUTURE) ×6 IMPLANT
SUT STEEL 6MS V (SUTURE) ×3 IMPLANT
SUT STEEL SZ 6 DBL 3X14 BALL (SUTURE) ×3 IMPLANT
SUT VIC AB 1 CTX 18 (SUTURE) ×6 IMPLANT
SUTURE E-PAK OPEN HEART (SUTURE) ×3 IMPLANT
SYSTEM SAHARA CHEST DRAIN ATS (WOUND CARE) ×3 IMPLANT
TAPE CLOTH SURG 4X10 WHT LF (GAUZE/BANDAGES/DRESSINGS) ×3 IMPLANT
TAPE PAPER 2X10 WHT MICROPORE (GAUZE/BANDAGES/DRESSINGS) ×3 IMPLANT
TOWEL GREEN STERILE (TOWEL DISPOSABLE) ×12 IMPLANT
TOWEL GREEN STERILE FF (TOWEL DISPOSABLE) ×6 IMPLANT
TOWEL OR 17X24 6PK STRL BLUE (TOWEL DISPOSABLE) ×6 IMPLANT
TOWEL OR 17X26 10 PK STRL BLUE (TOWEL DISPOSABLE) ×6 IMPLANT
TRAY FOLEY SILVER 16FR TEMP (SET/KITS/TRAYS/PACK) ×3 IMPLANT
TUBING INSUFFLATION (TUBING) ×3 IMPLANT
UNDERPAD 30X30 (UNDERPADS AND DIAPERS) ×3 IMPLANT
WATER STERILE IRR 1000ML POUR (IV SOLUTION) ×6 IMPLANT

## 2016-12-22 SURGICAL SUPPLY — 23 items
BALLN LINEAR 7.5FR IABP 40CC (BALLOONS) ×2
BALLOON LINEAR 7.5FR IABP 40CC (BALLOONS) ×1 IMPLANT
CATH INFINITI 5 FR JL3.5 (CATHETERS) ×2 IMPLANT
CATH INFINITI 5FR ANG PIGTAIL (CATHETERS) ×2 IMPLANT
CATH INFINITI 5FR JL4 (CATHETERS) ×2 IMPLANT
CATH LAUNCHER 5F EBU3.5 (CATHETERS) ×2 IMPLANT
CATH LAUNCHER 5F RADL (CATHETERS) ×1 IMPLANT
CATH OPTITORQUE TIG 4.0 5F (CATHETERS) ×2 IMPLANT
CATHETER LAUNCHER 5F RADL (CATHETERS) ×2
DEVICE RAD COMP TR BAND LRG (VASCULAR PRODUCTS) ×2 IMPLANT
DEVICE SECURE STATLOCK IABP (MISCELLANEOUS) ×6 IMPLANT
ELECT DEFIB PAD ADLT CADENCE (PAD) ×2 IMPLANT
GLIDESHEATH SLEND SS 6F .021 (SHEATH) ×2 IMPLANT
GUIDEWIRE INQWIRE 1.5J.035X260 (WIRE) ×1 IMPLANT
INQWIRE 1.5J .035X260CM (WIRE) ×2
KIT HEART LEFT (KITS) ×2 IMPLANT
PACK CARDIAC CATHETERIZATION (CUSTOM PROCEDURE TRAY) ×2 IMPLANT
SHEATH PINNACLE 6F 10CM (SHEATH) ×2 IMPLANT
SYR MEDRAD MARK V 150ML (SYRINGE) ×2 IMPLANT
TRANSDUCER W/STOPCOCK (MISCELLANEOUS) ×2 IMPLANT
TUBING CIL FLEX 10 FLL-RA (TUBING) ×2 IMPLANT
WIRE EMERALD 3MM-J .035X150CM (WIRE) ×2 IMPLANT
WIRE HI TORQ VERSACORE-J 145CM (WIRE) ×4 IMPLANT

## 2016-12-22 NOTE — H&P (View-Only) (Signed)
Progress Note  Patient Name: Brad Olson Date of Encounter: 12/22/2016  Primary Cardiologist: Dr. Purvis Sheffield  Subjective   No chest pain this AM. Feeling well. No SOB.  Inpatient Medications    Scheduled Meds: . aspirin EC  81 mg Oral Daily  . atorvastatin  80 mg Oral q1800  . carvedilol  3.125 mg Oral BID WC  . famotidine  20 mg Oral Daily  . fenofibrate  160 mg Oral Daily  . insulin aspart  0-5 Units Subcutaneous QHS  . insulin aspart  0-9 Units Subcutaneous TID WC  . lisinopril  2.5 mg Oral Daily  . sodium chloride flush  3 mL Intravenous Q12H   Continuous Infusions: . sodium chloride 50 mL/hr at 12/22/16 0616  . heparin 1,800 Units/hr (12/22/16 0355)   PRN Meds: sodium chloride, acetaminophen, ondansetron (ZOFRAN) IV, sodium chloride flush   Vital Signs    Vitals:   12/21/16 1611 12/21/16 1743 12/21/16 2115 12/22/16 0626  BP: 118/71 128/62 (!) 124/56 132/73  Pulse: 72 75 71 72  Resp: 18  18 18   Temp: 98.2 F (36.8 C) 98 F (36.7 C) 98.4 F (36.9 C) 97.4 F (36.3 C)  TempSrc: Oral Oral Oral Oral  SpO2: 96% 96% 96% 99%  Weight:      Height:        Intake/Output Summary (Last 24 hours) at 12/22/16 0821 Last data filed at 12/21/16 1719  Gross per 24 hour  Intake           181.95 ml  Output                0 ml  Net           181.95 ml   Filed Weights   12/20/16 1803 12/20/16 2313  Weight: 241 lb (109.3 kg) 240 lb (108.9 kg)    Telemetry    NSR - Personally Reviewed  Physical Exam   GEN: No acute distress.  HEENT: Normocephalic, atraumatic, sclera non-icteric. Neck: No JVD or bruits. Cardiac: RRR no murmurs, rubs, or gallops.  Radials/DP/PT 1+ and equal bilaterally.  Respiratory: Clear to auscultation bilaterally. Breathing is unlabored. GI: Soft, nontender, non-distended, BS +x 4. MS: no deformity. Extremities: No clubbing or cyanosis. No edema. Distal pedal pulses are 2+ and equal bilaterally. Neuro:  AAOx3. Follows commands. Psych:   Responds to questions appropriately with a normal affect.  Labs    Chemistry Recent Labs Lab 12/20/16 1911  NA 136  K 3.7  CL 100*  CO2 27  GLUCOSE 148*  BUN 21*  CREATININE 0.93  CALCIUM 9.0  GFRNONAA >60  GFRAA >60  ANIONGAP 9     Hematology Recent Labs Lab 12/20/16 1911 12/21/16 0526  WBC 8.6 8.1  RBC 4.87 4.80  HGB 16.1 15.6  HCT 46.3 45.9  MCV 95.1 95.6  MCH 33.1 32.5  MCHC 34.8 34.0  RDW 12.9 12.9  PLT 152 145*    Cardiac Enzymes Recent Labs Lab 12/20/16 2327 12/21/16 0526  TROPONINI 0.47* 0.33*    Recent Labs Lab 12/20/16 1925  TROPIPOC 0.30*     BNPNo results for input(s): BNP, PROBNP in the last 168 hours.   DDimer  Recent Labs Lab 12/21/16 0526  DDIMER <0.27     Radiology    Dg Chest 2 View  Result Date: 12/20/2016 CLINICAL DATA:  Left-sided chest pain radiates into the shoulder. EXAM: CHEST  2 VIEW COMPARISON:  None. FINDINGS: The heart size and mediastinal contours are within normal  limits. Both lungs are clear. The visualized skeletal structures are unremarkable. IMPRESSION: No active cardiopulmonary disease. Electronically Signed   By: Kennith Center M.D.   On: 12/20/2016 18:27    Cardiac Studies   2D Echo 12/21/16 - Procedure narrative: Transthoracic echocardiography. Image   quality was adequate. Intravenous contrast (Definity) was   administered. - Left ventricle: The cavity size was normal. Wall thickness was   increased in a pattern of moderate LVH. Systolic function was   severely reduced. The estimated ejection fraction was 30%.   Features are consistent with a pseudonormal left ventricular   filling pattern, with concomitant abnormal relaxation and   increased filling pressure (grade 2 diastolic dysfunction). - Regional wall motion abnormality: Hypokinesis of the apical   anterior, mid inferior, mid inferolateral, mid anterolateral,   apical lateral, and apical myocardium. - Aortic valve: Mildly calcified annulus.  Trileaflet.  Patient Profile     56 y.o. male with DM, hyperlipidemia, 30 years of tobacco abuse. obesity and family history of CAD (father died of MI age 48 with first MI several years earlier) - presented to [facility] with 2 episodes of left sided chest pain/burning over the last week.  Assessment & Plan    1. Chest pain/NSTEMI - stuttering CP over the last week with last episode Saturday 4/7. Downtrending troponin this admission, suspect it was probably higher the day of the event. Echo with LV dysfunction. Plan cath today. Risks/benefits already reviewed with patient yesterday. He is agreeable to proceeding. Continue ASA, BB, statin.   2. New cardiomyopathy, suspicious for ICM - BB/ACEI added this admission. No evidence of CHF on exam. Will need reassessment of EF in 3 months.  3. Frequent PVCs/bigeminy/trigeminy - improved s/p potassium repletion and beta blocker. Follow. If for some reason he is not found to have CAD on cath, would consider OP event monitor to quantify PVC burden. However, in the setting of the above, ectopy may be related to MI. F/u BMET pending this AM. Will also order for AM.  4. Hyperlipidemia - titrated statin to high intensity given DM and elevated troponin. Check baseline LFTs in AM. Trigs are high, suspect due to DM. Will need close OP f/u of DM.  5. Tobacco abuse - counseled on importance of cessation.   6. Diabetes mellitus - plan to resume meds once all procedures completed. Continue SSI.   Signed, Brad Montana, PA-C  12/22/2016, 8:21 AM    Patient examined chart reviewed. Obese white male tatoos no murmur clear lungs. Good right Radial pulse. Poorly controlled DM and elevated lipids. SEMI with EF 30% by echo Euvolemic On beta blocker and ACE. If he has CAD may need further evaluation of ectopy burden.  Risks of cath discussed including stroke, bleeding , MI and contrast reaction willing to proceed  [name]

## 2016-12-22 NOTE — Transfer of Care (Signed)
Immediate Anesthesia Transfer of Care Note  Patient: Clair Baldonado  Procedure(s) Performed: Procedure(s): CORONARY ARTERY BYPASS GRAFTING (CABG) x four using left internal mammary artery and right greater saphenous leg vein using endoscope.  Lima-LAD, SVG-PD, SVG-OM, SVG-Diag. (N/A) TRANSESOPHAGEAL ECHOCARDIOGRAM (TEE) (N/A)  Patient Location: ICU  Anesthesia Type:General  Level of Consciousness: sedated and Patient remains intubated per anesthesia plan  Airway & Oxygen Therapy: Patient remains intubated per anesthesia plan and Patient placed on Ventilator (see vital sign flow sheet for setting)  Post-op Assessment: Report given to RN  Post vital signs: Reviewed and stable  Last Vitals:  Vitals:   12/22/16 1236 12/22/16 1240  BP: 110/68 (!) 102/51  Pulse:  65  Resp:  12  Temp:      Last Pain:  Vitals:   12/22/16 0626  TempSrc: Oral  PainSc:          Complications: No apparent anesthesia complications

## 2016-12-22 NOTE — Anesthesia Preprocedure Evaluation (Addendum)
Anesthesia Evaluation  Patient identified by MRN, date of birth, ID band Patient awake    Reviewed: Allergy & Precautions, H&P , NPO status , Patient's Chart, lab work & pertinent test results  Airway Mallampati: III  TM Distance: >3 FB Neck ROM: Full    Dental no notable dental hx. (+) Teeth Intact, Dental Advisory Given   Pulmonary Current Smoker,    Pulmonary exam normal breath sounds clear to auscultation       Cardiovascular + CAD and + Past MI   Rhythm:Regular Rate:Normal     Neuro/Psych negative neurological ROS  negative psych ROS   GI/Hepatic negative GI ROS, Neg liver ROS,   Endo/Other  diabetes, Type 2, Oral Hypoglycemic Agents  Renal/GU negative Renal ROS  negative genitourinary   Musculoskeletal   Abdominal   Peds  Hematology negative hematology ROS (+)   Anesthesia Other Findings   Reproductive/Obstetrics negative OB ROS                            Anesthesia Physical Anesthesia Plan  ASA: IV and emergent  Anesthesia Plan: General   Post-op Pain Management:    Induction: Intravenous  Airway Management Planned: Oral ETT  Additional Equipment: Arterial line, CVP, PA Cath, TEE and Ultrasound Guidance Line Placement  Intra-op Plan:   Post-operative Plan: Post-operative intubation/ventilation  Informed Consent: I have reviewed the patients History and Physical, chart, labs and discussed the procedure including the risks, benefits and alternatives for the proposed anesthesia with the patient or authorized representative who has indicated his/her understanding and acceptance.   Dental advisory given  Plan Discussed with: CRNA  Anesthesia Plan Comments:         Anesthesia Quick Evaluation

## 2016-12-22 NOTE — Progress Notes (Signed)
ANTICOAGULATION CONSULT NOTE   Pharmacy Consult for heparin Indication: chest pain/ACS  Allergies  Allergen Reactions  . Nicotine Rash    Nicotine patch only.   Patient Measurements: Height: 5\' 11"  (180.3 cm) Weight: 240 lb (108.9 kg) IBW/kg (Calculated) : 75.3 Heparin Dosing Weight: 98.7 kg  Vital Signs: Temp: 97.4 F (36.3 C) (04/11 0626) Temp Source: Oral (04/11 0626) BP: 132/73 (04/11 0626) Pulse Rate: 72 (04/11 0626)  Labs:  Recent Labs  12/20/16 1911 12/20/16 2327 12/21/16 0526 12/21/16 1409 12/22/16 0520 12/22/16 0745 12/22/16 0913  HGB 16.1  --  15.6  --   --   --  14.4  HCT 46.3  --  45.9  --   --   --  42.1  PLT 152  --  145*  --   --   --  138*  LABPROT  --   --   --   --  13.4  --   --   INR  --   --   --   --  1.02  --   --   HEPARINUNFRC  --   --  <0.10* 0.43  --   --  0.54  CREATININE 0.93  --   --   --   --  1.01  --   TROPONINI  --  0.47* 0.33*  --   --   --   --     Estimated Creatinine Clearance: 104.9 mL/min (by C-G formula based on SCr of 1.01 mg/dL).  Medical History: Past Medical History:  Diagnosis Date  . Diabetes mellitus without complication (HCC)   . Hyperlipidemia   . Obesity   . Tobacco abuse    Medications:  See medication history  Assessment: 57 yo male on IV heparin for CP.  He was not on anticoagulation PTA.  Previous heparin level was therapeutic on current heparin drip rate 1800 units/hr. Today's AM heparin level = 0.54, remains therapeutic on IV heparin 1800 units/hr. Hgb remains wnl.  Pltc 152 on admit>>down to 138k.  No bleeding noted. Plan for cardiac cath today.   Goal of Therapy:  Heparin level 0.3-0.7 units/ml Monitor platelets by anticoagulation protocol: Yes   Plan:  Continue Heparin drip at 1800 units/hr Check heparin level daily while on heparin CBC daily Monitor for bleeding complications. f/u after cardiac cath today.   Noah Delaine, RPh Clinical Pharmacist Pager: [phone]-[phone]  [phone] [facility] [phone] 12/22/2016,10:31 AM

## 2016-12-22 NOTE — Progress Notes (Signed)
Right radial sheath removed and a TR band placed with 12 cc of air in the band.  Site looks good with no hematoma.  RN will deflate band per protocol.

## 2016-12-22 NOTE — Anesthesia Procedure Notes (Signed)
Procedure Name: Intubation Date/Time: 12/22/2016 2:29 PM Performed by: Mervyn Gay Pre-anesthesia Checklist: Patient identified, Patient being monitored, Timeout performed, Emergency Drugs available and Suction available Patient Re-evaluated:Patient Re-evaluated prior to inductionOxygen Delivery Method: Circle System Utilized Preoxygenation: Pre-oxygenation with 100% oxygen Intubation Type: IV induction Ventilation: Mask ventilation with difficulty, Oral airway inserted - appropriate to patient size and Two handed mask ventilation required Laryngoscope Size: Glidescope, Mac and 4 Grade View: Grade I Tube type: Subglottic suction tube Tube size: 7.5 mm Number of attempts: 1 Airway Equipment and Method: Stylet and Video-laryngoscopy Placement Confirmation: ETT inserted through vocal cords under direct vision,  positive ETCO2 and breath sounds checked- equal and bilateral Secured at: 23 cm Tube secured with: Tape Dental Injury: Teeth and Oropharynx as per pre-operative assessment  Difficulty Due To: Difficulty was anticipated, Difficult Airway- due to large tongue and Difficult Airway-  due to edematous airway

## 2016-12-22 NOTE — Progress Notes (Signed)
  Echocardiogram 2D Echocardiogram has been performed.  Janalyn Harder 12/22/2016, 2:50 PM

## 2016-12-22 NOTE — Brief Op Note (Addendum)
12/22/2016  6:28 PM  PATIENT:  Juliann Pares  56 y.o. male  PRE-OPERATIVE DIAGNOSIS:  SEVERE LEFT MAIN CAD  POST-OPERATIVE DIAGNOSIS:  SAME  PROCEDURE: Emergency  Procedure(s): CORONARY ARTERY BYPASS GRAFTING (CABG) (N/A) TRANSESOPHAGEAL ECHOCARDIOGRAM (TEE) (N/A) ENDOSCOPIC GREATER SAPHENOUS VEIN RIGHT LEG(EVH) LIMA-LAD SVG-PD SVG-OM SVG-DIAG  SURGEON:  Surgeon(s) and Role:    * Delight Ovens, MD - Primary  PHYSICIAN ASSISTANT: WAYNE GOLD PA-C  ANESTHESIA:   general  EBL:  Total I/O In: 1400 [I.V.:1400] Out: 485 [Urine:485]  BLOOD ADMINISTERED:none  DRAINS: ROUTINE CHEST DRAINS   LOCAL MEDICATIONS USED:  NONE  SPECIMEN:  NONE  DISPOSITION OF SPECIMEN:  N/A  COUNTS:  YES   DICTATION: .Other Dictation: Dictation Number PENDING  PLAN OF CARE: Admit to inpatient   PATIENT DISPOSITION:  ICU - intubated and hemodynamically stable.   Delay start of Pharmacological VTE agent (>24hrs) due to surgical blood loss or risk of bleeding: yes  COMPLICATIONS: NO KNOWN

## 2016-12-22 NOTE — Consult Note (Signed)
[address]       [place] [address]             [phone]        Brad Olson [facility] Medical Record #[id number] Date of Birth: 09/09/1961  Referring: Dr Tresa Endo  Primary Care: Vennie Homans, MD  Chief Complaint:    Chief Complaint  Patient presents with  . Chest Pain    History of Present Illness:    We are asked to see this 56 year old diabetic male in the Hernia catheterization lab where he was found to have a 99% left main stenosis as well as a significant right coronary artery lesion on catheterization this he presented to the hospital 2 days ago with increasing complaints of chest pain.Marland Kitchen He has multiple cardiac risk factors including diabetes mellitus, hyperlipidemia, obesity,and tobacco abuse. He developed chest pain complains approximate 2 weeks ago and they have had increasing intensity and frequency. He does also have a strong family history of coronary artery disease. He presented to the emergency department where initial troponin was 0.3. Cardiology consultation was obtained and ultimately [facility] was [facility] for further management and treatment to include cardiac catheterization. An echocardiogram was done yesterday and showed an ejection fraction of 30% felt to be probably ischemic in origin. Due to the severity of the coronary artery disease she is felt to require prompt surgical intervention. An intra-aortic balloon pump has been placed in the catheterization lab. The patient is currently on heparin but has not been given any other anticoagulation therapy.   Current Activity/ Functional Status: Patient is independent with mobility/ambulation, transfers, ADL's, IADL's.   Zubrod Score: At the time of surgery this patient's most appropriate activity status/level should be described as: []     0    Normal activity, no symptoms [x]     1    Restricted in physical strenuous activity but ambulatory, able to  do out light work []     2    Ambulatory and capable of self care, unable to do work activities, up and about                 more than 50%  Of the time                            []     3    Only limited self care, in bed greater than 50% of waking hours []     4    Completely disabled, no self care, confined to bed or chair []     5    Moribund  Past Medical History:  Diagnosis Date  . Diabetes mellitus without complication (HCC)   . Hyperlipidemia   . Obesity   . Tobacco abuse     Past Surgical History:  Procedure Laterality Date  . COLON RESECTION     due to bowel rupture    History  Smoking Status  . Current Every Day Smoker  . Packs/day: 1.00  Smokeless Tobacco  . Never Used    Comment: >30 years    History  Alcohol Use  . Yes    Comment: Very rarely    Social History   Social History  . Marital status: Married    Spouse name: N/A  . Number of children: N/A  . Years of education: N/A   Occupational History  . Not on file.  Social History Main Topics  . Smoking status: Current Every Day Smoker    Packs/day: 1.00  . Smokeless tobacco: Never Used     Comment: >30 years  . Alcohol use Yes     Comment: Very rarely  . Drug use: No  . Sexual activity: Not on file   Other Topics Concern  . Not on file   Social History Narrative  . No narrative on file    Allergies  Allergen Reactions  . Nicotine Rash    Nicotine patch only.    Current Facility-Administered Medications  Medication Dose Route Frequency Provider Last Rate Last Dose  . 0.9 %  sodium chloride infusion  250 mL Intravenous PRN Dayna N Dunn, PA-C      . 0.9 %  sodium chloride infusion   Intravenous Continuous Dayna N Dunn, PA-C 50 mL/hr at 12/22/16 0616    . 0.9 %  sodium chloride infusion    Continuous PRN Lennette Bihari, MD 109 mL/hr at 12/22/16 1109 1.001 mL/kg/hr at 12/22/16 1109  . 0.9 %  sodium chloride infusion    Continuous PRN Lennette Bihari, MD 10 mL/hr at 12/22/16 1255 10 mL/hr  at 12/22/16 1255  . [MAR Hold] acetaminophen (TYLENOL) tablet 650 mg  650 mg Oral Q4H PRN Meredeth Ide, MD      . Mitzi Hansen Hold] aspirin EC tablet 81 mg  81 mg Oral Daily Dayna N Dunn, PA-C   81 mg at 12/21/16 1017  . [MAR Hold] atorvastatin (LIPITOR) tablet 80 mg  80 mg Oral q1800 Dayna N Dunn, PA-C      . [MAR Hold] carvedilol (COREG) tablet 3.125 mg  3.125 mg Oral BID WC Laqueta Linden, MD   3.125 mg at 12/22/16 0739  . cefUROXime (ZINACEF) 1.5 g in dextrose 5 % 50 mL IVPB  1.5 g Intravenous To OR Delight Ovens, MD      . cefUROXime (ZINACEF) 750 mg in dextrose 5 % 50 mL IVPB  750 mg Intravenous To OR Delight Ovens, MD      . dexmedetomidine (PRECEDEX) 400 MCG/100ML (4 mcg/mL) infusion  0.1-0.7 mcg/kg/hr Intravenous To OR Delight Ovens, MD      . DOPamine (INTROPIN) 800 mg in dextrose 5 % 250 mL (3.2 mg/mL) infusion  0-10 mcg/kg/min Intravenous To OR Delight Ovens, MD      . EPINEPHrine (ADRENALIN) 4 mg in dextrose 5 % 250 mL (0.016 mg/mL) infusion  0-10 mcg/min Intravenous To OR Delight Ovens, MD      . Mitzi Hansen Hold] famotidine (PEPCID) tablet 20 mg  20 mg Oral Daily Meredeth Ide, MD   20 mg at 12/21/16 1013  . [MAR Hold] fenofibrate tablet 160 mg  160 mg Oral Daily Leroy Sea, MD   160 mg at 12/21/16 1614  . fentaNYL (SUBLIMAZE) injection    PRN Lennette Bihari, MD   50 mcg at 12/22/16 1153  . heparin 2,500 Units, papaverine 30 mg in electrolyte-148 (PLASMALYTE-148) 500 mL irrigation   Irrigation To OR Delight Ovens, MD      . heparin 30,000 units/NS 1000 mL solution for CELLSAVER   Other To OR Delight Ovens, MD      . heparin ADULT infusion 100 units/mL (25000 units/223mL sodium chloride 0.45%)  1,800 Units/hr Intravenous Continuous Leroy Sea, MD 18 mL/hr at 12/22/16 0355 1,800 Units/hr at 12/22/16 0355  . heparin ADULT infusion 100 units/mL (25000 units/250mL sodium chloride 0.45%)  Continuous PRN Lennette Bihari, MD 12 mL/hr at 12/22/16 1243 1,200  Units/hr at 12/22/16 1243  . heparin infusion 2 units/mL in 0.9 % sodium chloride    Continuous PRN Lennette Bihari, MD   2,000 mL at 12/22/16 1102  . heparin injection    PRN Lennette Bihari, MD   5,500 Units at 12/22/16 1123  . [MAR Hold] insulin aspart (novoLOG) injection 0-5 Units  0-5 Units Subcutaneous QHS Leroy Sea, MD   2 Units at 12/21/16 2128  . [MAR Hold] insulin aspart (novoLOG) injection 0-9 Units  0-9 Units Subcutaneous TID WC Leroy Sea, MD      . insulin regular (NOVOLIN R,HUMULIN R) 250 Units in sodium chloride 0.9 % 250 mL (1 Units/mL) infusion   Intravenous To OR Delight Ovens, MD      . iopamidol (ISOVUE-370) 76 % injection    PRN Lennette Bihari, MD   140 mL at 12/22/16 1248  . lidocaine (PF) (XYLOCAINE) 1 % injection    PRN Lennette Bihari, MD   15 mL at 12/22/16 1153  . [MAR Hold] lisinopril (PRINIVIL,ZESTRIL) tablet 2.5 mg  2.5 mg Oral Daily Laqueta Linden, MD   2.5 mg at 12/22/16 0740  . magnesium sulfate (IV Push/IM) injection 40 mEq  40 mEq Other To OR Delight Ovens, MD      . midazolam (VERSED) injection    PRN Lennette Bihari, MD   1 mg at 12/22/16 1159  . nitroGLYCERIN 50 mg in dextrose 5 % 250 mL (0.2 mg/mL) infusion  2-200 mcg/min Intravenous To OR Delight Ovens, MD      . Mitzi Hansen Hold] ondansetron [facility]) injection 4 mg  4 mg Intravenous Q6H PRN Meredeth Ide, MD      . phenylephrine (NEO-SYNEPHRINE) 20 mg in sodium chloride 0.9 % 250 mL (0.08 mg/mL) infusion  30-200 mcg/min Intravenous To OR Delight Ovens, MD      . potassium chloride injection 80 mEq  80 mEq Other To OR Delight Ovens, MD      . Radial Cocktail/Verapamil only    PRN Lennette Bihari, MD   10 mL at 12/22/16 1121  . sodium chloride flush (NS) 0.9 % injection 3 mL  3 mL Intravenous Q12H Dayna N Dunn, PA-C      . sodium chloride flush (NS) 0.9 % injection 3 mL  3 mL Intravenous PRN Dayna N Dunn, PA-C      . tranexamic acid (CYKLOKAPRON) 2,500 mg in sodium chloride 0.9 % 250  mL (10 mg/mL) infusion  1.5 mg/kg/hr Intravenous To OR Delight Ovens, MD      . tranexamic acid (CYKLOKAPRON) bolus via infusion - over 30 minutes 1,633.5 mg  15 mg/kg Intravenous To OR Delight Ovens, MD      . tranexamic acid (CYKLOKAPRON) pump prime solution 218 mg  2 mg/kg Intracatheter To OR Delight Ovens, MD      . vancomycin (VANCOCIN) 1,500 mg in sodium chloride 0.9 % 250 mL IVPB  1,500 mg Intravenous To OR Delight Ovens, MD        Prescriptions Prior to Admission  Medication Sig Dispense Refill Last Dose  . aspirin EC 81 MG tablet Take 81 mg by mouth daily.   12/20/2016 at Unknown time  . DiphenhydrAMINE HCl, Sleep, (SLEEP AID) 50 MG CAPS Take 50 mg by mouth at bedtime.   12/19/2016 at Unknown time  . glyBURIDE (DIABETA)  5 MG tablet Take 5 mg by mouth 2 (two) times daily.  6 12/20/2016 at Unknown time  . JARDIANCE 25 MG TABS tablet Take 25 mg by mouth daily.  5 12/20/2016 at Unknown time  . Multiple Vitamin (MULTIVITAMIN WITH MINERALS) TABS tablet Take 1 tablet by mouth daily.   12/20/2016 at Unknown time  . pravastatin (PRAVACHOL) 40 MG tablet Take 40 mg by mouth daily.  7 12/20/2016 at Unknown time  . ranitidine (ZANTAC) 75 MG tablet Take 150 mg by mouth every evening.   12/19/2016 at Unknown time    Family History  Problem Relation Age of Onset  . CAD Father     died of massive MI at age 46, first MI several years earlier  . Lung disease Father      Review of Systems:      Cardiac Review of Systems: Y or N  Chest Pain [ y   ]  Resting SOB [   ] Exertional SOB  [  ]  Orthopnea [  ]   Pedal Edema [   ]    Palpitations [  ] Syncope  [  ]   Presyncope [   ]  General Review of Systems: [Y] = yes [  ]=no Constitional: recent weight change [  ]; anorexia [  ]; fatigue [  ]; nausea [  ]; night sweats [  ]; fever [  ]; or chills [  ]                                                               Dental: poor dentition[  ]; Last Dentist visit:   Eye : blurred vision [  ]; diplopia  [   ]; vision changes [  ];  Amaurosis fugax[  ]; Resp: cough [  ];  wheezing[  ];  hemoptysis[  ]; shortness of breath[  ]; paroxysmal nocturnal dyspnea[  ]; dyspnea on exertion[  ]; or orthopnea[  ];  GI:  gallstones[  ], vomiting[  ];  dysphagia[  ]; melena[  ];  hematochezia [  ]; heartburn[  ];   Hx of  Colonoscopy[  ]; GU: kidney stones [  ]; hematuria[  ];   dysuria [  ];  nocturia[  ];  history of     obstruction [  ]; urinary frequency [  ]             Skin: rash, swelling[  ];, hair loss[  ];  peripheral edema[  ];  or itching[  ]; Musculosketetal: myalgias[  ];  joint swelling[  ];  joint erythema[  ];  joint pain[  ];  back pain[  ];  Heme/Lymph: bruising[  ];  bleeding[  ];  anemia[  ];  Neuro: TIA[  ];  headaches[  ];  stroke[  ];  vertigo[  ];  seizures[  ];   paresthesias[  ];  difficulty walking[  ];  Psych:depression[  ]; anxiety[  ];  Endocrine: diabetes[  ];  thyroid dysfunction[  ];  Immunizations: Flu [  ]; Pneumococcal[  ];  Other:  Physical Exam: BP (!) 102/51   Pulse 65   Temp 97.4 F (36.3 C) (Oral)   Resp 12   Ht 5\' 11"  (1.803 m)  Wt 240 lb (108.9 kg)   SpO2 97%   BMI 33.47 kg/m    General appearance: alert and cooperative Neck: no adenopathy, no carotid bruit, no JVD, supple, symmetrical, trachea midline and thyroid not enlarged, symmetric, no tenderness/mass/nodules Resp: diminished breath sounds bibasilar Back: symmetric, no curvature. ROM normal. No CVA tenderness. Cardio: regular rate and rhythm, S1, S2 normal, no murmur, click, rub or gallop GI: soft, non-tender; bowel sounds normal; no masses,  no organomegaly Extremities: extremities normal, atraumatic, no cyanosis or edema and Homans sign is negative, no sign of DVT Neurologic: Grossly normal  Doppler suggest bilaterial carptid stenosis   Diagnostic Studies & Laboratory data:     Recent Radiology Findings:   Dg Chest 2 View  Result Date: 12/20/2016 CLINICAL DATA:  Left-sided chest pain  radiates into the shoulder. EXAM: CHEST  2 VIEW COMPARISON:  None. FINDINGS: The heart size and mediastinal contours are within normal limits. Both lungs are clear. The visualized skeletal structures are unremarkable. IMPRESSION: No active cardiopulmonary disease. Electronically Signed   By: Kennith Center M.D.   On: 12/20/2016 18:27     I have independently reviewed the above radiologic studies.  Recent Lab Findings: Lab Results  Component Value Date   WBC 7.4 12/22/2016   HGB 14.4 12/22/2016   HCT 42.1 12/22/2016   PLT 138 (L) 12/22/2016   GLUCOSE 164 (H) 12/22/2016   CHOL 202 (H) 12/21/2016   TRIG 444 (H) 12/21/2016   HDL 37 (L) 12/21/2016   LDLCALC UNABLE TO CALCULATE IF TRIGLYCERIDE OVER 400 mg/dL 57/84/6962   NA 952 84/13/2440   K 4.5 12/22/2016   CL 104 12/22/2016   CREATININE 1.01 12/22/2016   BUN 24 (H) 12/22/2016   CO2 26 12/22/2016   INR 1.02 12/22/2016   HGBA1C 8.5 (H) 12/21/2016   Severe left main osteil stenisis geater the 95  Cir small , poor distal vessels  30% ef by echo   Assessment / Plan:   criticial left main with poor distal vessels suggest emergent cabg now with iab ib\n place     The goals risks and alternatives of the planned surgical procedure Procedure(s): CORONARY ARTERY BYPASS GRAFTING (CABG) (N/A) TRANSESOPHAGEAL ECHOCARDIOGRAM (TEE) (N/A)  have been discussed with the patient in detail. The risks of the procedure including death, infection, stroke, myocardial infarction, bleeding, blood transfusion have all been discussed specifically.  I have quoted Brad Olson a 10 % of perioperative mortality and a complication rate as high as 60 %. The patient's questions have been answered.Brad Olson is willing  to proceed with the planned procedure.  Delight Ovens MD      [address] [place] [place] [address] Office [phone]   Beeper [phone]

## 2016-12-22 NOTE — Progress Notes (Signed)
 Progress Note  Patient Name: Brad Olson Date of Encounter: 12/22/2016  Primary Cardiologist: Dr. Koneswaran  Subjective   No chest pain this AM. Feeling well. No SOB.  Inpatient Medications    Scheduled Meds: . aspirin EC  81 mg Oral Daily  . atorvastatin  80 mg Oral q1800  . carvedilol  3.125 mg Oral BID WC  . famotidine  20 mg Oral Daily  . fenofibrate  160 mg Oral Daily  . insulin aspart  0-5 Units Subcutaneous QHS  . insulin aspart  0-9 Units Subcutaneous TID WC  . lisinopril  2.5 mg Oral Daily  . sodium chloride flush  3 mL Intravenous Q12H   Continuous Infusions: . sodium chloride 50 mL/hr at 12/22/16 0616  . heparin 1,800 Units/hr (12/22/16 0355)   PRN Meds: sodium chloride, acetaminophen, ondansetron (ZOFRAN) IV, sodium chloride flush   Vital Signs    Vitals:   12/21/16 1611 12/21/16 1743 12/21/16 2115 12/22/16 0626  BP: 118/71 128/62 (!) 124/56 132/73  Pulse: 72 75 71 72  Resp: 18  18 18  Temp: 98.2 F (36.8 C) 98 F (36.7 C) 98.4 F (36.9 C) 97.4 F (36.3 C)  TempSrc: Oral Oral Oral Oral  SpO2: 96% 96% 96% 99%  Weight:      Height:        Intake/Output Summary (Last 24 hours) at 12/22/16 0821 Last data filed at 12/21/16 1719  Gross per 24 hour  Intake           181.95 ml  Output                0 ml  Net           181.95 ml   Filed Weights   12/20/16 1803 12/20/16 2313  Weight: 241 lb (109.3 kg) 240 lb (108.9 kg)    Telemetry    NSR - Personally Reviewed  Physical Exam   GEN: No acute distress.  HEENT: Normocephalic, atraumatic, sclera non-icteric. Neck: No JVD or bruits. Cardiac: RRR no murmurs, rubs, or gallops.  Radials/DP/PT 1+ and equal bilaterally.  Respiratory: Clear to auscultation bilaterally. Breathing is unlabored. GI: Soft, nontender, non-distended, BS +x 4. MS: no deformity. Extremities: No clubbing or cyanosis. No edema. Distal pedal pulses are 2+ and equal bilaterally. Neuro:  AAOx3. Follows commands. Psych:   Responds to questions appropriately with a normal affect.  Labs    Chemistry Recent Labs Lab 12/20/16 1911  NA 136  K 3.7  CL 100*  CO2 27  GLUCOSE 148*  BUN 21*  CREATININE 0.93  CALCIUM 9.0  GFRNONAA >60  GFRAA >60  ANIONGAP 9     Hematology Recent Labs Lab 12/20/16 1911 12/21/16 0526  WBC 8.6 8.1  RBC 4.87 4.80  HGB 16.1 15.6  HCT 46.3 45.9  MCV 95.1 95.6  MCH 33.1 32.5  MCHC 34.8 34.0  RDW 12.9 12.9  PLT 152 145*    Cardiac Enzymes Recent Labs Lab 12/20/16 2327 12/21/16 0526  TROPONINI 0.47* 0.33*    Recent Labs Lab 12/20/16 1925  TROPIPOC 0.30*     BNPNo results for input(s): BNP, PROBNP in the last 168 hours.   DDimer  Recent Labs Lab 12/21/16 0526  DDIMER <0.27     Radiology    Dg Chest 2 View  Result Date: 12/20/2016 CLINICAL DATA:  Left-sided chest pain radiates into the shoulder. EXAM: CHEST  2 VIEW COMPARISON:  None. FINDINGS: The heart size and mediastinal contours are within normal   limits. Both lungs are clear. The visualized skeletal structures are unremarkable. IMPRESSION: No active cardiopulmonary disease. Electronically Signed   By: Eric  Mansell M.D.   On: 12/20/2016 18:27    Cardiac Studies   2D Echo 12/21/16 - Procedure narrative: Transthoracic echocardiography. Image   quality was adequate. Intravenous contrast (Definity) was   administered. - Left ventricle: The cavity size was normal. Wall thickness was   increased in a pattern of moderate LVH. Systolic function was   severely reduced. The estimated ejection fraction was 30%.   Features are consistent with a pseudonormal left ventricular   filling pattern, with concomitant abnormal relaxation and   increased filling pressure (grade 2 diastolic dysfunction). - Regional wall motion abnormality: Hypokinesis of the apical   anterior, mid inferior, mid inferolateral, mid anterolateral,   apical lateral, and apical myocardium. - Aortic valve: Mildly calcified annulus.  Trileaflet.  Patient Profile     56 y.o. male with DM, hyperlipidemia, 30 years of tobacco abuse. obesity and family history of CAD (father died of MI age 55 with first MI several years earlier) - presented to [facility] with 2 episodes of left sided chest pain/burning over the last week.  Assessment & Plan    1. Chest pain/NSTEMI - stuttering CP over the last week with last episode Saturday 4/7. Downtrending troponin this admission, suspect it was probably higher the day of the event. Echo with LV dysfunction. Plan cath today. Risks/benefits already reviewed with patient yesterday. He is agreeable to proceeding. Continue ASA, BB, statin.   2. New cardiomyopathy, suspicious for ICM - BB/ACEI added this admission. No evidence of CHF on exam. Will need reassessment of EF in 3 months.  3. Frequent PVCs/bigeminy/trigeminy - improved s/p potassium repletion and beta blocker. Follow. If for some reason he is not found to have CAD on cath, would consider OP event monitor to quantify PVC burden. However, in the setting of the above, ectopy may be related to MI. F/u BMET pending this AM. Will also order for AM.  4. Hyperlipidemia - titrated statin to high intensity given DM and elevated troponin. Check baseline LFTs in AM. Trigs are high, suspect due to DM. Will need close OP f/u of DM.  5. Tobacco abuse - counseled on importance of cessation.   6. Diabetes mellitus - plan to resume meds once all procedures completed. Continue SSI.   Signed, Dayna N Dunn, PA-C  12/22/2016, 8:21 AM    Patient examined chart reviewed. Obese white male tatoos no murmur clear lungs. Good right Radial pulse. Poorly controlled DM and elevated lipids. SEMI with EF 30% by echo Euvolemic On beta blocker and ACE. If he has CAD may need further evaluation of ectopy burden.  Risks of cath discussed including stroke, bleeding , MI and contrast reaction willing to proceed  Shaft Corigliano   

## 2016-12-22 NOTE — Interval H&P Note (Signed)
Cath Lab Visit (complete for each Cath Lab visit)  Clinical Evaluation Leading to the Procedure:   ACS: No.  Non-ACS:    Anginal Classification: CCS III  Anti-ischemic medical therapy: Maximal Therapy (2 or more classes of medications)  Non-Invasive Test Results: No non-invasive testing performed  Prior CABG: No previous CABG      History and Physical Interval Note:  12/22/2016 11:11 AM  Brad Olson  has presented today for surgery, with the diagnosis of nstemi  The various methods of treatment have been discussed with the patient and family. After consideration of risks, benefits and other options for treatment, the patient has consented to  Procedure(s): Left Heart Cath and Coronary Angiography (N/A) as a surgical intervention .  The patient's history has been reviewed, patient examined, no change in status, stable for surgery.  I have reviewed the patient's chart and labs.  Questions were answered to the patient's satisfaction.     Nicki Guadalajara

## 2016-12-22 NOTE — Progress Notes (Signed)
*  PRELIMINARY RESULTS* Vascular Ultrasound Carotid Duplex (Doppler) has been completed.  Preliminary findings: Difficult to assess level of stenosis with patient on balloon pump. Significant heterogenous/ calcific plaque at bilateral ICA origin with elevated systolic velocities (Left > Right). Right ICA systolic velocity increases from 129 cm/s to 246 cm/s at origin.  Left ICA systolic velocity increases from 60 cm/s to 452 cm/s at origin.    Farrel Demark, RDMS, RVT  12/22/2016, 1:45 PM

## 2016-12-22 NOTE — Anesthesia Procedure Notes (Signed)
Central Venous Catheter Insertion Performed by: Roderic Palau, anesthesiologist Start/End4/07/2017 2:05 PM, 12/22/2016 2:15 PM Patient location: Pre-op. Preanesthetic checklist: patient identified, IV checked, site marked, risks and benefits discussed, surgical consent, monitors and equipment checked, pre-op evaluation, timeout performed and anesthesia consent Position: Trendelenburg Lidocaine 1% used for infiltration and patient sedated Hand hygiene performed , maximum sterile barriers used  and Seldinger technique used Catheter size: 9 Fr Total catheter length 10. Central line and PA cath was placed.MAC introducer Swan type:thermodilution PA Cath depth:50 Procedure performed using ultrasound guided technique. Ultrasound Notes:anatomy identified, needle tip was noted to be adjacent to the nerve/plexus identified, no ultrasound evidence of intravascular and/or intraneural injection and image(s) printed for medical record Attempts: 1 Following insertion, line sutured and dressing applied. Post procedure assessment: blood return through all ports, free fluid flow and no air  Patient tolerated the procedure well with no immediate complications.

## 2016-12-23 ENCOUNTER — Encounter (HOSPITAL_COMMUNITY): Payer: Self-pay | Admitting: Cardiovascular Disease

## 2016-12-23 ENCOUNTER — Inpatient Hospital Stay (HOSPITAL_COMMUNITY): Payer: BLUE CROSS/BLUE SHIELD

## 2016-12-23 LAB — HEPATIC FUNCTION PANEL
ALBUMIN: 3.4 g/dL — AB (ref 3.5–5.0)
ALK PHOS: 26 U/L — AB (ref 38–126)
ALT: 35 U/L (ref 17–63)
AST: 60 U/L — ABNORMAL HIGH (ref 15–41)
BILIRUBIN DIRECT: 0.2 mg/dL (ref 0.1–0.5)
BILIRUBIN TOTAL: 1.2 mg/dL (ref 0.3–1.2)
Indirect Bilirubin: 1 mg/dL — ABNORMAL HIGH (ref 0.3–0.9)
Total Protein: 5.2 g/dL — ABNORMAL LOW (ref 6.5–8.1)

## 2016-12-23 LAB — MAGNESIUM
Magnesium: 2.4 mg/dL (ref 1.7–2.4)
Magnesium: 2.2 mg/dL (ref 1.7–2.4)

## 2016-12-23 LAB — POCT I-STAT, CHEM 8
BUN: 21 mg/dL — ABNORMAL HIGH (ref 6–20)
BUN: 27 mg/dL — AB (ref 6–20)
CALCIUM ION: 1.22 mmol/L (ref 1.15–1.40)
CHLORIDE: 102 mmol/L (ref 101–111)
CHLORIDE: 103 mmol/L (ref 101–111)
Calcium, Ion: 1.15 mmol/L (ref 1.15–1.40)
Creatinine, Ser: 1.1 mg/dL (ref 0.61–1.24)
Creatinine, Ser: 1.1 mg/dL (ref 0.61–1.24)
GLUCOSE: 174 mg/dL — AB (ref 65–99)
GLUCOSE: 137 mg/dL — AB (ref 65–99)
HCT: 35 % — ABNORMAL LOW (ref 39.0–52.0)
HCT: 38 % — ABNORMAL LOW (ref 39.0–52.0)
Hemoglobin: 11.9 g/dL — ABNORMAL LOW (ref 13.0–17.0)
Hemoglobin: 12.9 g/dL — ABNORMAL LOW (ref 13.0–17.0)
POTASSIUM: 4.6 mmol/L (ref 3.5–5.1)
Potassium: 4.9 mmol/L (ref 3.5–5.1)
Sodium: 138 mmol/L (ref 135–145)
Sodium: 138 mmol/L (ref 135–145)
TCO2: 23 mmol/L (ref 0–100)
TCO2: 24 mmol/L (ref 0–100)

## 2016-12-23 LAB — POCT I-STAT 3, ART BLOOD GAS (G3+)
Acid-base deficit: 2 mmol/L (ref 0.0–2.0)
Acid-base deficit: 3 mmol/L — ABNORMAL HIGH (ref 0.0–2.0)
Acid-base deficit: 2 mmol/L (ref 0.0–2.0)
BICARBONATE: 23 mmol/L (ref 20.0–28.0)
Bicarbonate: 21.7 mmol/L (ref 20.0–28.0)
Bicarbonate: 24 mmol/L (ref 20.0–28.0)
O2 Saturation: 97 %
O2 Saturation: 93 %
O2 Saturation: 93 %
PCO2 ART: 35.4 mmHg (ref 32.0–48.0)
PH ART: 7.4 (ref 7.350–7.450)
PH ART: 7.317 — AB (ref 7.350–7.450)
PH ART: 7.329 — AB (ref 7.350–7.450)
Patient temperature: 38.3
TCO2: 23 mmol/L (ref 0–100)
TCO2: 24 mmol/L (ref 0–100)
TCO2: 25 mmol/L (ref 0–100)
pCO2 arterial: 45.6 mmHg (ref 32.0–48.0)
pCO2 arterial: 46.2 mmHg (ref 32.0–48.0)
pO2, Arterial: 96 mmHg (ref 83.0–108.0)
pO2, Arterial: 76 mmHg — ABNORMAL LOW (ref 83.0–108.0)
pO2, Arterial: 78 mmHg — ABNORMAL LOW (ref 83.0–108.0)

## 2016-12-23 LAB — GLUCOSE, CAPILLARY
GLUCOSE-CAPILLARY: 148 mg/dL — AB (ref 65–99)
GLUCOSE-CAPILLARY: 192 mg/dL — AB (ref 65–99)
GLUCOSE-CAPILLARY: 153 mg/dL — AB (ref 65–99)
GLUCOSE-CAPILLARY: 125 mg/dL — AB (ref 65–99)
GLUCOSE-CAPILLARY: 112 mg/dL — AB (ref 65–99)
GLUCOSE-CAPILLARY: 112 mg/dL — AB (ref 65–99)
GLUCOSE-CAPILLARY: 191 mg/dL — AB (ref 65–99)
Glucose-Capillary: 111 mg/dL — ABNORMAL HIGH (ref 65–99)
Glucose-Capillary: 115 mg/dL — ABNORMAL HIGH (ref 65–99)
Glucose-Capillary: 158 mg/dL — ABNORMAL HIGH (ref 65–99)
Glucose-Capillary: 170 mg/dL — ABNORMAL HIGH (ref 65–99)
Glucose-Capillary: 173 mg/dL — ABNORMAL HIGH (ref 65–99)
Glucose-Capillary: 179 mg/dL — ABNORMAL HIGH (ref 65–99)
Glucose-Capillary: 207 mg/dL — ABNORMAL HIGH (ref 65–99)
Glucose-Capillary: 213 mg/dL — ABNORMAL HIGH (ref 65–99)
Glucose-Capillary: 72 mg/dL (ref 65–99)
Glucose-Capillary: 99 mg/dL (ref 65–99)

## 2016-12-23 LAB — POCT ACTIVATED CLOTTING TIME: ACTIVATED CLOTTING TIME: 142 s

## 2016-12-23 LAB — BASIC METABOLIC PANEL
Anion gap: 9 (ref 5–15)
BUN: 18 mg/dL (ref 6–20)
CHLORIDE: 105 mmol/L (ref 101–111)
CO2: 22 mmol/L (ref 22–32)
CREATININE: 1.05 mg/dL (ref 0.61–1.24)
Calcium: 8.2 mg/dL — ABNORMAL LOW (ref 8.9–10.3)
GFR calc Af Amer: >60 mL/min (ref >60)
GFR calc non Af Amer: >60 mL/min (ref >60)
GLUCOSE: 199 mg/dL — AB (ref 65–99)
Potassium: 4.8 mmol/L (ref 3.5–5.1)
SODIUM: 136 mmol/L (ref 135–145)

## 2016-12-23 LAB — CBC
HCT: 36.8 % — ABNORMAL LOW (ref 39.0–52.0)
HEMATOCRIT: 38.5 % — AB (ref 39.0–52.0)
Hemoglobin: 12.7 g/dL — ABNORMAL LOW (ref 13.0–17.0)
Hemoglobin: 12.6 g/dL — ABNORMAL LOW (ref 13.0–17.0)
MCH: 32.6 pg (ref 26.0–34.0)
MCH: 31.7 pg (ref 26.0–34.0)
MCHC: 34.5 g/dL (ref 30.0–36.0)
MCHC: 32.7 g/dL (ref 30.0–36.0)
MCV: 94.6 fL (ref 78.0–100.0)
MCV: 96.7 fL (ref 78.0–100.0)
PLATELETS: 104 10*3/uL — AB (ref 150–400)
Platelets: 111 10*3/uL — ABNORMAL LOW (ref 150–400)
RBC: 3.89 MIL/uL — AB (ref 4.22–5.81)
RBC: 3.98 MIL/uL — ABNORMAL LOW (ref 4.22–5.81)
RDW: 13.4 % (ref 11.5–15.5)
RDW: 13.5 % (ref 11.5–15.5)
WBC: 12.3 10*3/uL — AB (ref 4.0–10.5)
WBC: 19.8 10*3/uL — ABNORMAL HIGH (ref 4.0–10.5)

## 2016-12-23 LAB — CREATININE, SERUM
Creatinine, Ser: 1.22 mg/dL (ref 0.61–1.24)
GFR calc Af Amer: >60 mL/min (ref >60)
GFR calc non Af Amer: >60 mL/min (ref >60)

## 2016-12-23 LAB — COOXEMETRY PANEL
Carboxyhemoglobin: 1.4 % (ref 0.5–1.5)
Methemoglobin: 0.9 % (ref 0.0–1.5)
O2 Saturation: 78.3 %
Total hemoglobin: 12 g/dL (ref 12.0–16.0)

## 2016-12-23 MED ORDER — INSULIN ASPART 100 UNIT/ML ~~LOC~~ SOLN
0.0000 [IU] | SUBCUTANEOUS | Status: DC
  Administered 2016-12-23: 8 [IU] via SUBCUTANEOUS
  Administered 2016-12-23 – 2016-12-24 (×4): 4 [IU] via SUBCUTANEOUS

## 2016-12-23 MED ORDER — ORAL CARE MOUTH RINSE
15.0000 mL | Freq: Two times a day (BID) | OROMUCOSAL | Status: DC
  Administered 2016-12-23 – 2016-12-26 (×2): 15 mL via OROMUCOSAL

## 2016-12-23 MED ORDER — INSULIN DETEMIR 100 UNIT/ML ~~LOC~~ SOLN
20.0000 [IU] | Freq: Every day | SUBCUTANEOUS | Status: DC
  Administered 2016-12-23: 20 [IU] via SUBCUTANEOUS
  Filled 2016-12-23 (×2): qty 0.2

## 2016-12-23 MED ORDER — DEXMEDETOMIDINE HCL IN NACL 400 MCG/100ML IV SOLN
0.0000 ug/kg/h | INTRAVENOUS | Status: DC
  Administered 2016-12-23: 0.7 ug/kg/h via INTRAVENOUS
  Filled 2016-12-23: qty 100

## 2016-12-23 MED ORDER — INSULIN DETEMIR 100 UNIT/ML ~~LOC~~ SOLN: 20.0000 [IU] | Freq: Every day | SUBCUTANEOUS | Status: DC

## 2016-12-23 MED FILL — Sodium Chloride IV Soln 0.9%: INTRAVENOUS | Qty: 2000 | Status: AC

## 2016-12-23 MED FILL — Electrolyte-R (PH 7.4) Solution: INTRAVENOUS | Qty: 4000 | Status: AC

## 2016-12-23 MED FILL — Heparin Sodium (Porcine) Inj 1000 Unit/ML: INTRAMUSCULAR | Qty: 20 | Status: AC

## 2016-12-23 MED FILL — Mannitol IV Soln 20%: INTRAVENOUS | Qty: 500 | Status: AC

## 2016-12-23 MED FILL — Sodium Bicarbonate IV Soln 8.4%: INTRAVENOUS | Qty: 50 | Status: AC

## 2016-12-23 MED FILL — Lidocaine HCl IV Inj 20 MG/ML: INTRAVENOUS | Qty: 5 | Status: AC

## 2016-12-23 MED FILL — Calcium Chloride Inj 10%: INTRAVENOUS | Qty: 10 | Status: AC

## 2016-12-23 NOTE — Progress Notes (Addendum)
IABP and sheath removed from right femoral artery per protocol. Manual pressure held for 30 minutes and hemostasis obtained. Pressure dressing applied to site. Palpable PT Pulses positive both pre- and post- removal, 1+ to right lower extremity.  Slight bruising noted to insertion area. Site is level 0, soft to touch. Post instructions given to patient and his wife. Understanding verbalized.

## 2016-12-23 NOTE — Procedures (Signed)
Extubation Procedure Note  Patient Details:   Name: Brad Olson DOB: 03/03/1961 MRN: [id number]   Airway Documentation:  Airway 7.5 mm (Active)  Secured at (cm) 23 cm 12/23/2016  4:05 AM  Measured From Lips 12/23/2016  4:05 AM  Secured Location Right 12/23/2016  4:05 AM  Secured By Pink Tape 12/23/2016  4:05 AM  Site Condition Dry 12/23/2016  4:05 AM    Evaluation  O2 sats: stable throughout Complications: No apparent complications Patient did tolerate procedure well. Bilateral Breath Sounds: Clear, Diminished   Yes  Patient had a NIF of -30 and a VC 800. Extubated patient to 4L nasal cannula. Patient oriented to time and place.   Morley Kos 12/23/2016, [phone]

## 2016-12-23 NOTE — Progress Notes (Signed)
Progress Note  Patient Name: Schyler Counsell Date of Encounter: 12/23/2016  Primary Cardiologist: Dr. Bronson Ing  Subjective   Just extubated Voice hoarse Spoke with son   Inpatient Medications    Scheduled Meds: . acetaminophen  1,000 mg Oral Q6H   Or  . acetaminophen (TYLENOL) oral liquid 160 mg/5 mL  1,000 mg Per Tube Q6H  . aspirin EC  325 mg Oral Daily   Or  . aspirin  324 mg Per Tube Daily  . atorvastatin  80 mg Oral q1800  . bisacodyl  10 mg Oral Daily   Or  . bisacodyl  10 mg Rectal Daily  . cefUROXime (ZINACEF)  IV  1.5 g Intravenous Q12H  . chlorhexidine gluconate (MEDLINE KIT)  15 mL Mouth Rinse BID  . docusate sodium  200 mg Oral Daily  . famotidine (PEPCID) IV  20 mg Intravenous Q12H  . famotidine  20 mg Oral Daily  . fenofibrate  160 mg Oral Daily  . insulin aspart  0-5 Units Subcutaneous QHS  . insulin aspart  0-9 Units Subcutaneous TID WC  . insulin regular  0-10 Units Intravenous TID WC  . lisinopril  2.5 mg Oral Daily  . mouth rinse  15 mL Mouth Rinse QID  . metoprolol tartrate  12.5 mg Oral BID   Or  . metoprolol tartrate  12.5 mg Per Tube BID  . [START ON 12/24/2016] pantoprazole  40 mg Oral Daily  . sodium chloride flush  3 mL Intravenous Q12H  . sodium chloride flush  3 mL Intravenous Q12H   Continuous Infusions: . sodium chloride 20 mL/hr at 12/23/16 0800  . sodium chloride    . sodium chloride 20 mL/hr at 12/23/16 0800  . dexmedetomidine Stopped (12/23/16 0530)  . DOPamine 2.008 mcg/kg/min (12/23/16 0800)  . insulin (NOVOLIN-R) infusion 7.4 Units/hr (12/23/16 0800)  . lactated ringers    . lactated ringers 20 mL/hr at 12/23/16 0800  . nitroGLYCERIN Stopped (12/22/16 2115)  . phenylephrine (NEO-SYNEPHRINE) Adult infusion 20 mcg/min (12/23/16 0800)   PRN Meds: sodium chloride, sodium chloride, acetaminophen, albumin human, lactated ringers, metoprolol, midazolam, morphine injection, morphine injection, ondansetron (ZOFRAN) IV, oxyCODONE,  sodium chloride flush, sodium chloride flush, traMADol   Vital Signs    Vitals:   12/23/16 0730 12/23/16 0749 12/23/16 0800 12/23/16 0820  BP: 126/68  (!) 97/58   Pulse: (!) 102  89 90  Resp: (!) 25  20 16   Temp: (!) 100.6 F (38.1 C)  (!) 100.9 F (38.3 C)   TempSrc:      SpO2: 96% 93% 95% 96%  Weight:      Height:        Intake/Output Summary (Last 24 hours) at 12/23/16 0851 Last data filed at 12/23/16 0800  Gross per 24 hour  Intake           5942.4 ml  Output             3809 ml  Net           2133.4 ml   Filed Weights   12/20/16 1803 12/20/16 2313  Weight: 241 lb (109.3 kg) 240 lb (108.9 kg)    Telemetry    NSR - Personally Reviewed  Physical Exam   Affect appropriate Healthy:  appears stated age HEENT: central line voice hoarse  Neck supple with no adenopathy JVP normal no bruits no thyromegaly Lungs clear with no wheezing and good diaphragmatic motion Heart:  No rub post sternotomy  PMI normal  Abdomen: benighn, BS positve, no tenderness, no AAA no bruit.  No HSM or HJR Distal pulses intact with no bruits Plus one  Edema SVG harvest  Neuro non-focal Skin warm and dry No muscular weakness   Labs    Chemistry Recent Labs Lab 12/20/16 1911 12/22/16 0745  12/22/16 2014 12/22/16 2113 12/23/16 0359 12/23/16 0615  NA 136 138  < > 139 139 136 138  K 3.7 4.5  < > 4.1 4.4 4.8 4.6  CL 100* 104  < > 105  --  105 102  CO2 27 26  --   --   --  22  --   GLUCOSE 148* 164*  < > 94 134* 199* 174*  BUN 21* 24*  < > 23*  --  18 21*  CREATININE 0.93 1.01  < > 1.00  --  1.05 1.10  CALCIUM 9.0 8.7*  --   --   --  8.2*  --   PROT  --   --   --   --   --  5.2*  --   ALBUMIN  --   --   --   --   --  3.4*  --   AST  --   --   --   --   --  60*  --   ALT  --   --   --   --   --  35  --   ALKPHOS  --   --   --   --   --  26*  --   BILITOT  --   --   --   --   --  1.2  --   GFRNONAA >60 >60  --   --   --  >60  --   GFRAA >60 >60  --   --   --  >60  --     ANIONGAP 9 8  --   --   --  9  --   < > = values in this interval not displayed.   Hematology Recent Labs Lab 12/22/16 0913  12/22/16 1750  12/22/16 2111 12/22/16 2113 12/23/16 0359 12/23/16 0615  WBC 7.4  --   --   --  13.9*  --  12.3*  --   RBC 4.45  --   --   --  3.96*  --  3.89*  --   HGB 14.4  < > 10.9*  < > 12.4* 11.9* 12.7* 11.9*  HCT 42.1  < > 32.5*  < > 37.8* 35.0* 36.8* 35.0*  MCV 94.6  --   --   --  95.5  --  94.6  --   MCH 32.4  --   --   --  31.3  --  32.6  --   MCHC 34.2  --   --   --  32.8  --  34.5  --   RDW 12.9  --   --   --  13.0  --  13.4  --   PLT 138*  --  126*  --  113*  --  104*  --   < > = values in this interval not displayed.  Cardiac Enzymes  Recent Labs Lab 12/20/16 2327 12/21/16 0526  TROPONINI 0.47* 0.33*     Recent Labs Lab 12/20/16 1925  TROPIPOC 0.30*     BNPNo results for input(s): BNP, PROBNP in the last 168 hours.   DDimer   Recent Labs  Lab 12/21/16 0526  DDIMER <0.27     Radiology    Dg Chest Port 1 View  Result Date: 12/23/2016 CLINICAL DATA:  Status post CABG with intra-aortic balloon pump placement EXAM: PORTABLE CHEST 1 VIEW COMPARISON:  Portable chest x-ray of December 22, 2016 FINDINGS: The lungs remain mildly hypoinflated. The interstitial markings are mildly increased. The pulmonary vascularity is engorged. There is no pneumothorax nor significant pleural effusion. The cardiac silhouette is enlarged. The sternal wires are intact. The intra-aortic balloon pump marker appears to be in stable position just inferior to the aortic arch. The Swan-Ganz catheter tip projects in the proximal main pulmonary outflow tract. The endotracheal tube tip lies approximately 3.9 cm above the carina. The mediastinal drain and the left chest tube are in stable position. IMPRESSION: Mild CHF. No pneumothorax, pleural effusion, or pneumonia. The support tubes are in reasonable position. Electronically Signed   By: David  Martinique M.D.   On:  12/23/2016 07:59   Dg Chest Port 1 View  Result Date: 12/22/2016 CLINICAL DATA:  Initial evaluation status post CABG. EXAM: PORTABLE CHEST 1 VIEW COMPARISON:  Prior radiograph from 12/20/2016. FINDINGS: Endotracheal tube in place with tip positioned approximately 4 cm above the carina. Enteric tube courses in the the abdomen, with side hole overlying the proximal stomach. Right IJ approach Swan-Ganz catheter in place with tip overlying the main pulmonary outflow tract. Median sternotomy wires with underlying CABG markers now evident. Possible mediastinal drain in place. Cardiomegaly noted. Compare widening of the mediastinum likely in part related AP technique, lordotic angulation common shallow lung inflation. Left-sided chest tube in place with tip overlying the left upper lobe. Lungs hypoinflated. Mild perihilar vascular congestion without overt pulmonary edema. Mild atelectasis at the retrocardiac left lower lobe. No focal infiltrates. No pleural effusion. No pneumothorax. No acute osseus abnormality. IMPRESSION: 1. Tip of the endotracheal tube 4 cm above the carina. Remaining support apparatus in satisfactory position as above. 2. Sequelae of interval CABG. 3. Cardiomegaly with mild perihilar vascular congestion without overt pulmonary edema. 4. Mild left basilar atelectasis. Electronically Signed   By: Jeannine Boga M.D.   On: 12/22/2016 21:52    Cardiac Studies   2D Echo 12/21/16 - Procedure narrative: Transthoracic echocardiography. Image   quality was adequate. Intravenous contrast (Definity) was   administered. - Left ventricle: The cavity size was normal. Wall thickness was   increased in a pattern of moderate LVH. Systolic function was   severely reduced. The estimated ejection fraction was 30%.   Features are consistent with a pseudonormal left ventricular   filling pattern, with concomitant abnormal relaxation and   increased filling pressure (grade 2 diastolic dysfunction). -  Regional wall motion abnormality: Hypokinesis of the apical   anterior, mid inferior, mid inferolateral, mid anterolateral,   apical lateral, and apical myocardium. - Aortic valve: Mildly calcified annulus. Trileaflet.  Patient Profile     56 y.o. male with DM, hyperlipidemia, 30 years of tobacco abuse. obesity and family history of CAD (father died of MI age 60 with first MI several years earlier) - presented to [facility] with 2 episodes of left sided chest pain/burning over the last week.  Assessment & Plan    1. 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 DCM:  Will need cardiac MRI 3 months post revascularization to risk stratify for AICD  3. Frequent PVCs/bigeminy/trigeminy - better pre  cath with K repletion and beta blocker  Likely related to ischemia A pacing now   4. Hyperlipidemia - titrated statin to high intensity given DM and elevated troponin. Check baseline LFTs in AM. Trigs are high, suspect due to DM. Will need close OP f/u of DM.  5. Tobacco abuse - counseled on importance of cessation.   6. Diabetes mellitus - plan to resume meds once all procedures completed. Continue SSI.   Aprpeciate  CVTS timely care   Jenkins Rouge

## 2016-12-23 NOTE — Progress Notes (Signed)
Patient ID: Jeziel Hoffmann, male   DOB: 1961-04-05, 56 y.o.   MRN: [id number] [facility] DAILY ICU PROGRESS NOTE                   [address]            [place] [address]          [phone]   1 Day Post-Op Procedure(s) (LRB): CORONARY ARTERY BYPASS GRAFTING (CABG) x four using left internal mammary artery and right greater saphenous leg vein using endoscope.  Lima-LAD, SVG-PD, SVG-OM, SVG-Diag. (N/A) TRANSESOPHAGEAL ECHOCARDIOGRAM (TEE) (N/A)  Total Length of Stay:  LOS: 2 days   Subjective: Now extubated, alert and neuro intact  Objective: Vital signs in last 24 hours: Temp:  [97.9 F (36.6 C)-101.1 F (38.4 C)] 100.2 F (37.9 C) (04/12 1000) Pulse Rate:  [64-102] 88 (04/12 1000) Cardiac Rhythm: Atrial paced (04/12 0800) Resp:  [0-66] 21 (04/12 1000) BP: (73-147)/(28-98) 101/60 (04/12 1000) SpO2:  [93 %-100 %] 96 % (04/12 1000) Arterial Line BP: (86-161)/(45-110) 89/68 (04/12 1000) FiO2 (%):  [40 %-50 %] 40 % (04/12 0800)  Filed Weights   12/20/16 1803 12/20/16 2313  Weight: 241 lb (109.3 kg) 240 lb (108.9 kg)    Weight change:    Hemodynamic parameters for last 24 hours: PAP: (22-37)/(9-24) 37/12 CO:  [4.4 L/min-6.2 L/min] 6.2 L/min CI:  [1.9 L/min/m2-2.7 L/min/m2] 2.7 L/min/m2  Intake/Output from previous day: 04/11 0701 - 04/12 0700 In: 5855.9 [I.V.:3880.9; Blood:825; IV JJHERDEYC:1448] Out: 1856 [Urine:3535; Emesis/NG output:50; Chest Tube:134]  Intake/Output this shift: Total I/O In: 252.6 [I.V.:252.6] Out: 205 [Urine:155; Chest Tube:50]  Current Meds: Scheduled Meds: . acetaminophen  1,000 mg Oral Q6H   Or  . acetaminophen (TYLENOL) oral liquid 160 mg/5 mL  1,000 mg Per Tube Q6H  . aspirin EC  325 mg Oral Daily   Or  . aspirin  324 mg Per Tube Daily  . atorvastatin  80 mg Oral q1800  . bisacodyl  10 mg Oral Daily   Or  . bisacodyl  10 mg Rectal Daily  . cefUROXime (ZINACEF)  IV  1.5 g Intravenous Q12H  . chlorhexidine gluconate  (MEDLINE KIT)  15 mL Mouth Rinse BID  . docusate sodium  200 mg Oral Daily  . famotidine  20 mg Oral Daily  . fenofibrate  160 mg Oral Daily  . insulin aspart  0-5 Units Subcutaneous QHS  . insulin aspart  0-9 Units Subcutaneous TID WC  . insulin regular  0-10 Units Intravenous TID WC  . lisinopril  2.5 mg Oral Daily  . mouth rinse  15 mL Mouth Rinse QID  . metoprolol tartrate  12.5 mg Oral BID   Or  . metoprolol tartrate  12.5 mg Per Tube BID  . [START ON 12/24/2016] pantoprazole  40 mg Oral Daily  . sodium chloride flush  3 mL Intravenous Q12H  . sodium chloride flush  3 mL Intravenous Q12H   Continuous Infusions: . sodium chloride 20 mL/hr at 12/23/16 1000  . sodium chloride    . sodium chloride 20 mL/hr at 12/23/16 1000  . dexmedetomidine Stopped (12/23/16 0530)  . DOPamine 2.008 mcg/kg/min (12/23/16 1000)  . insulin (NOVOLIN-R) infusion 4.1 Units/hr (12/23/16 1000)  . lactated ringers    . lactated ringers 20 mL/hr at 12/23/16 1000  . nitroGLYCERIN Stopped (12/22/16 2115)  . phenylephrine (NEO-SYNEPHRINE) Adult infusion 15 mcg/min (12/23/16 1000)   PRN Meds:.sodium chloride, sodium chloride, acetaminophen, albumin human, lactated ringers,  metoprolol, midazolam, morphine injection, ondansetron (ZOFRAN) IV, oxyCODONE, sodium chloride flush, sodium chloride flush, traMADol  General appearance: alert, cooperative, appears older than stated age and no distress Neurologic: intact Heart: regular rate and rhythm, S1, S2 normal, no murmur, click, rub or gallop Lungs: diminished breath sounds bibasilar Abdomen: soft, non-tender; bowel sounds normal; no masses,  no organomegaly Extremities: extremities normal, atraumatic, no cyanosis or edema and Homans sign is negative, no sign of DVT Wound: sternum intact  Lab Results: CBC: Recent Labs  12/22/16 2111  12/23/16 0359 12/23/16 0615  WBC 13.9*  --  12.3*  --   HGB 12.4*  < > 12.7* 11.9*  HCT 37.8*  < > 36.8* 35.0*  PLT 113*  --   104*  --   < > = values in this interval not displayed. BMET:  Recent Labs  12/22/16 0745  12/23/16 0359 12/23/16 0615  NA 138  < > 136 138  K 4.5  < > 4.8 4.6  CL 104  < > 105 102  CO2 26  --  22  --   GLUCOSE 164*  < > 199* 174*  BUN 24*  < > 18 21*  CREATININE 1.01  < > 1.05 1.10  CALCIUM 8.7*  --  8.2*  --   < > = values in this interval not displayed.  CMET: Lab Results  Component Value Date   WBC 12.3 (H) 12/23/2016   HGB 11.9 (L) 12/23/2016   HCT 35.0 (L) 12/23/2016   PLT 104 (L) 12/23/2016   GLUCOSE 174 (H) 12/23/2016   CHOL 202 (H) 12/21/2016   TRIG 444 (H) 12/21/2016   HDL 37 (L) 12/21/2016   LDLCALC UNABLE TO CALCULATE IF TRIGLYCERIDE OVER 400 mg/dL 12/21/2016   ALT 35 12/23/2016   AST 60 (H) 12/23/2016   NA 138 12/23/2016   K 4.6 12/23/2016   CL 102 12/23/2016   CREATININE 1.10 12/23/2016   BUN 21 (H) 12/23/2016   CO2 22 12/23/2016   INR 1.27 12/22/2016   HGBA1C 8.5 (H) 12/21/2016      PT/INR:  Recent Labs  12/22/16 2111  LABPROT 16.0*  INR 1.27   Radiology: Dg Chest Port 1 View  Result Date: 12/23/2016 CLINICAL DATA:  Status post CABG with intra-aortic balloon pump placement EXAM: PORTABLE CHEST 1 VIEW COMPARISON:  Portable chest x-ray of December 22, 2016 FINDINGS: The lungs remain mildly hypoinflated. The interstitial markings are mildly increased. The pulmonary vascularity is engorged. There is no pneumothorax nor significant pleural effusion. The cardiac silhouette is enlarged. The sternal wires are intact. The intra-aortic balloon pump marker appears to be in stable position just inferior to the aortic arch. The Swan-Ganz catheter tip projects in the proximal main pulmonary outflow tract. The endotracheal tube tip lies approximately 3.9 cm above the carina. The mediastinal drain and the left chest tube are in stable position. IMPRESSION: Mild CHF. No pneumothorax, pleural effusion, or pneumonia. The support tubes are in reasonable position.  Electronically Signed   By: David  Martinique M.D.   On: 12/23/2016 07:59   Dg Chest Port 1 View  Result Date: 12/22/2016 CLINICAL DATA:  Initial evaluation status post CABG. EXAM: PORTABLE CHEST 1 VIEW COMPARISON:  Prior radiograph from 12/20/2016. FINDINGS: Endotracheal tube in place with tip positioned approximately 4 cm above the carina. Enteric tube courses in the the abdomen, with side hole overlying the proximal stomach. Right IJ approach Swan-Ganz catheter in place with tip overlying the main pulmonary outflow tract. Median sternotomy  wires with underlying CABG markers now evident. Possible mediastinal drain in place. Cardiomegaly noted. Compare widening of the mediastinum likely in part related AP technique, lordotic angulation common shallow lung inflation. Left-sided chest tube in place with tip overlying the left upper lobe. Lungs hypoinflated. Mild perihilar vascular congestion without overt pulmonary edema. Mild atelectasis at the retrocardiac left lower lobe. No focal infiltrates. No pleural effusion. No pneumothorax. No acute osseus abnormality. IMPRESSION: 1. Tip of the endotracheal tube 4 cm above the carina. Remaining support apparatus in satisfactory position as above. 2. Sequelae of interval CABG. 3. Cardiomegaly with mild perihilar vascular congestion without overt pulmonary edema. 4. Mild left basilar atelectasis. Electronically Signed   By: Jeannine Boga M.D.   On: 12/22/2016 21:52     Assessment/Plan: S/P Procedure(s) (LRB): CORONARY ARTERY BYPASS GRAFTING (CABG) x four using left internal mammary artery and right greater saphenous leg vein using endoscope.  Lima-LAD, SVG-PD, SVG-OM, SVG-Diag. (N/A) TRANSESOPHAGEAL ECHOCARDIOGRAM (TEE) (N/A) Diabetes control Continue foley due to strict I&O, patient critically ill, patient in ICU, urinary output monitoring and iab in place  See progression orders Wean iab poss remove todar  Expected Acute  Blood - loss Anemia  Grace Isaac 12/23/2016 10:53 AM

## 2016-12-23 NOTE — Progress Notes (Signed)
[facility] BRIEF SICU PROGRESS NOTE  1 Day Post-Op  S/P Procedure(s) (LRB): CORONARY ARTERY BYPASS GRAFTING (CABG) x four using left internal mammary artery and right greater saphenous leg vein using endoscope.  Lima-LAD, SVG-PD, SVG-OM, SVG-Diag. (N/A) TRANSESOPHAGEAL ECHOCARDIOGRAM (TEE) (N/A)   Stable day NSR w/ stable BP Breathing comfortably w/ O2 sats 95%  UOP adequate  Plan: Continue current plan  Purcell Nails, MD 12/23/2016 7:53 PM

## 2016-12-23 NOTE — Care Management Note (Signed)
Case Management Note  Patient Details  Name: Brad Olson MRN: [id number] Date of Birth: Mar 16, 1961  Subjective/Objective:     Pt is s/p CABG                Action/Plan:   PTA independent from home.     Expected Discharge Date:                  Expected Discharge Plan:     In-House Referral:     Discharge planning Services  CM Consult  Post Acute Care Choice:    Choice offered to:     DME Arranged:    DME Agency:     HH Arranged:    HH Agency:     Status of Service:     If discussed at [employer], dates discussed:    Additional Comments:  Cherylann Parr, RN 12/23/2016, 2:28 PM

## 2016-12-23 NOTE — Progress Notes (Signed)
RT note: Patient with good effort had Incentive spirometer efforts times 5 of 650-700.

## 2016-12-23 NOTE — Anesthesia Postprocedure Evaluation (Addendum)
Anesthesia Post Note  Patient: Brad Olson  Procedure(s) Performed: Procedure(s) (LRB): CORONARY ARTERY BYPASS GRAFTING (CABG) x four using left internal mammary artery and right greater saphenous leg vein using endoscope.  Lima-LAD, SVG-PD, SVG-OM, SVG-Diag. (N/A) TRANSESOPHAGEAL ECHOCARDIOGRAM (TEE) (N/A)  Patient location during evaluation: SICU Anesthesia Type: General Level of consciousness: sedated and patient remains intubated per anesthesia plan Pain management: pain level controlled Vital Signs Assessment: post-procedure vital signs reviewed and stable Respiratory status: patient remains intubated per anesthesia plan and patient on ventilator - see flowsheet for VS Cardiovascular status: blood pressure returned to baseline Anesthetic complications: no       Last Vitals:  Vitals:   12/22/16 2345 12/23/16 0000  BP: (!) 103/58 104/75  Pulse: 88 89  Resp: 16 16  Temp: 37.2 C 37.2 C    Last Pain:  Vitals:   12/22/16 0626  TempSrc: Oral  PainSc:                  Jaxon Flatt COKER

## 2016-12-24 ENCOUNTER — Inpatient Hospital Stay (HOSPITAL_COMMUNITY): Payer: BLUE CROSS/BLUE SHIELD

## 2016-12-24 LAB — BASIC METABOLIC PANEL
Anion gap: 9 (ref 5–15)
BUN: 30 mg/dL — ABNORMAL HIGH (ref 6–20)
CO2: 24 mmol/L (ref 22–32)
Calcium: 8.5 mg/dL — ABNORMAL LOW (ref 8.9–10.3)
Chloride: 101 mmol/L (ref 101–111)
Creatinine, Ser: 1.24 mg/dL (ref 0.61–1.24)
GFR calc Af Amer: >60 mL/min (ref >60)
GFR calc non Af Amer: >60 mL/min (ref >60)
Glucose, Bld: 163 mg/dL — ABNORMAL HIGH (ref 65–99)
Potassium: 4.5 mmol/L (ref 3.5–5.1)
Sodium: 134 mmol/L — ABNORMAL LOW (ref 135–145)

## 2016-12-24 LAB — GLUCOSE, CAPILLARY
GLUCOSE-CAPILLARY: 166 mg/dL — AB (ref 65–99)
GLUCOSE-CAPILLARY: 161 mg/dL — AB (ref 65–99)
GLUCOSE-CAPILLARY: 195 mg/dL — AB (ref 65–99)
Glucose-Capillary: 162 mg/dL — ABNORMAL HIGH (ref 65–99)
Glucose-Capillary: 147 mg/dL — ABNORMAL HIGH (ref 65–99)

## 2016-12-24 LAB — CBC
HCT: 34.6 % — ABNORMAL LOW (ref 39.0–52.0)
Hemoglobin: 11.5 g/dL — ABNORMAL LOW (ref 13.0–17.0)
MCH: 31.9 pg (ref 26.0–34.0)
MCHC: 33.2 g/dL (ref 30.0–36.0)
MCV: 95.8 fL (ref 78.0–100.0)
PLATELETS: 89 10*3/uL — AB (ref 150–400)
RBC: 3.61 MIL/uL — AB (ref 4.22–5.81)
RDW: 13.6 % (ref 11.5–15.5)
WBC: 16.9 10*3/uL — AB (ref 4.0–10.5)

## 2016-12-24 LAB — COOXEMETRY PANEL
Carboxyhemoglobin: 1.6 % — ABNORMAL HIGH (ref 0.5–1.5)
Methemoglobin: 0.9 % (ref 0.0–1.5)
O2 Saturation: 56.4 %
Total hemoglobin: 11.7 g/dL — ABNORMAL LOW (ref 12.0–16.0)

## 2016-12-24 MED ORDER — INSULIN DETEMIR 100 UNIT/ML ~~LOC~~ SOLN
20.0000 [IU] | Freq: Two times a day (BID) | SUBCUTANEOUS | Status: DC
  Administered 2016-12-24 – 2016-12-26 (×6): 20 [IU] via SUBCUTANEOUS
  Filled 2016-12-24 (×7): qty 0.2

## 2016-12-24 MED ORDER — ENOXAPARIN SODIUM 40 MG/0.4ML ~~LOC~~ SOLN
40.0000 mg | Freq: Every day | SUBCUTANEOUS | Status: DC
  Administered 2016-12-24 – 2016-12-28 (×5): 40 mg via SUBCUTANEOUS
  Filled 2016-12-24 (×6): qty 0.4

## 2016-12-24 MED ORDER — INSULIN DETEMIR 100 UNIT/ML ~~LOC~~ SOLN: 20.0000 [IU] | Freq: Two times a day (BID) | SUBCUTANEOUS | Status: DC

## 2016-12-24 MED ORDER — INSULIN ASPART 100 UNIT/ML ~~LOC~~ SOLN
0.0000 [IU] | SUBCUTANEOUS | Status: DC
  Administered 2016-12-24: 4 [IU] via SUBCUTANEOUS
  Administered 2016-12-24 – 2016-12-25 (×3): 2 [IU] via SUBCUTANEOUS

## 2016-12-24 MED ORDER — INSULIN DETEMIR 100 UNIT/ML ~~LOC~~ SOLN
20.0000 [IU] | Freq: Two times a day (BID) | SUBCUTANEOUS | Status: DC
  Filled 2016-12-24: qty 0.2

## 2016-12-24 MED ORDER — CLOPIDOGREL BISULFATE 75 MG PO TABS
75.0000 mg | ORAL_TABLET | Freq: Every day | ORAL | Status: DC
  Administered 2016-12-24 – 2016-12-29 (×6): 75 mg via ORAL
  Filled 2016-12-24 (×6): qty 1

## 2016-12-24 NOTE — Progress Notes (Signed)
Patient ID: Brad Olson, male   DOB: 01-14-1961, 56 y.o.   MRN: [id number] TCTS DAILY ICU PROGRESS NOTE                   [address]            [place] [address]          [phone]   2 Days Post-Op Procedure(s) (LRB): CORONARY ARTERY BYPASS GRAFTING (CABG) x four using left internal mammary artery and right greater saphenous leg vein using endoscope.  Lima-LAD, SVG-PD, SVG-OM, SVG-Diag. (N/A) TRANSESOPHAGEAL ECHOCARDIOGRAM (TEE) (N/A)  Total Length of Stay:  LOS: 3 days   Subjective: Alert , awake and feels better, iab out yesterday   Objective: Vital signs in last 24 hours: Temp:  [97.7 F (36.5 C)-100.9 F (38.3 C)] 97.9 F (36.6 C) (04/13 0600) Pulse Rate:  [44-92] 76 (04/13 0600) Cardiac Rhythm: Normal sinus rhythm (04/13 0600) Resp:  [16-28] 24 (04/13 0600) BP: (97-133)/(54-90) 124/60 (04/13 0600) SpO2:  [88 %-97 %] 97 % (04/13 0600) Arterial Line BP: (89-146)/(48-85) 144/58 (04/13 0700) FiO2 (%):  [40 %] 40 % (04/12 0800) Weight:  [247 lb 5.7 oz (112.2 kg)] 247 lb 5.7 oz (112.2 kg) (04/13 0500)  Filed Weights   12/20/16 1803 12/20/16 2313 12/24/16 0500  Weight: 241 lb (109.3 kg) 240 lb (108.9 kg) 247 lb 5.7 oz (112.2 kg)    Weight change:    Hemodynamic parameters for last 24 hours: PAP: (31-52)/(7-23) 40/8 CO:  [4.9 L/min-7.5 L/min] 7.5 L/min CI:  [2.1 L/min/m2-3.3 L/min/m2] 3.3 L/min/m2  Intake/Output from previous day: 04/12 0701 - 04/13 0700 In: 1325 [P.O.:60; I.V.:1165; IV Piggyback:100] Out: 1270 [Urine:1110; Chest Tube:160]  Intake/Output this shift: No intake/output data recorded.  Current Meds: Scheduled Meds: . acetaminophen  1,000 mg Oral Q6H   Or  . acetaminophen (TYLENOL) oral liquid 160 mg/5 mL  1,000 mg Per Tube Q6H  . aspirin EC  325 mg Oral Daily   Or  . aspirin  324 mg Per Tube Daily  . atorvastatin  80 mg Oral q1800  . bisacodyl  10 mg Oral Daily   Or  . bisacodyl  10 mg Rectal Daily  . cefUROXime  (ZINACEF)  IV  1.5 g Intravenous Q12H  . clopidogrel  75 mg Oral Daily  . docusate sodium  200 mg Oral Daily  . famotidine  20 mg Oral Daily  . insulin aspart  0-24 Units Subcutaneous Q4H  . insulin detemir  20 Units Subcutaneous BID  . lisinopril  2.5 mg Oral Daily  . mouth rinse  15 mL Mouth Rinse BID  . metoprolol tartrate  12.5 mg Oral BID   Or  . metoprolol tartrate  12.5 mg Per Tube BID  . pantoprazole  40 mg Oral Daily  . sodium chloride flush  3 mL Intravenous Q12H  . sodium chloride flush  3 mL Intravenous Q12H   Continuous Infusions: . sodium chloride 10 mL/hr at 12/24/16 0600  . sodium chloride    . sodium chloride 10 mL/hr at 12/23/16 2000  . lactated ringers    . lactated ringers 20 mL/hr at 12/24/16 0600  . phenylephrine (NEO-SYNEPHRINE) Adult infusion Stopped (12/23/16 1500)   PRN Meds:.sodium chloride, sodium chloride, acetaminophen, lactated ringers, metoprolol, morphine injection, ondansetron (ZOFRAN) IV, oxyCODONE, sodium chloride flush, sodium chloride flush, traMADol  General appearance: alert, cooperative and appears older than stated age Neurologic: intact Heart: regular rate and rhythm, S1, S2 normal, no murmur,  click, rub or gallop Lungs: diminished breath sounds bibasilar Abdomen: soft, non-tender; bowel sounds normal; no masses,  no organomegaly Extremities: extremities normal, atraumatic, no cyanosis or edema, Homans sign is negative, no sign of DVT and right groin cath site ok, pedal pulses bilaterial Wound: sternum stable  Lab Results: CBC: Recent Labs  12/23/16 1606 12/23/16 1616 12/24/16 0330  WBC 19.8*  --  16.9*  HGB 12.6* 12.9* 11.5*  HCT 38.5* 38.0* 34.6*  PLT 111*  --  89*   BMET:  Recent Labs  12/23/16 0359  12/23/16 1616 12/24/16 0330  NA 136  < > 138 134*  K 4.8  < > 4.9 4.5  CL 105  < > 103 101  CO2 22  --   --  24  GLUCOSE 199*  < > 137* 163*  BUN 18  < > 27* 30*  CREATININE 1.05  < > 1.10 1.24  CALCIUM 8.2*  --   --   8.5*  < > = values in this interval not displayed.  CMET: Lab Results  Component Value Date   WBC 16.9 (H) 12/24/2016   HGB 11.5 (L) 12/24/2016   HCT 34.6 (L) 12/24/2016   PLT 89 (L) 12/24/2016   GLUCOSE 163 (H) 12/24/2016   CHOL 202 (H) 12/21/2016   TRIG 444 (H) 12/21/2016   HDL 37 (L) 12/21/2016   LDLCALC UNABLE TO CALCULATE IF TRIGLYCERIDE OVER 400 mg/dL 40/98/1191   ALT 35 47/82/9562   AST 60 (H) 12/23/2016   NA 134 (L) 12/24/2016   K 4.5 12/24/2016   CL 101 12/24/2016   CREATININE 1.24 12/24/2016   BUN 30 (H) 12/24/2016   CO2 24 12/24/2016   INR 1.27 12/22/2016   HGBA1C 8.5 (H) 12/21/2016      PT/INR:  Recent Labs  12/22/16 2111  LABPROT 16.0*  INR 1.27   Radiology: Dg Chest Port 1 View  Result Date: 12/24/2016 CLINICAL DATA:  Postop status. EXAM: PORTABLE CHEST 1 VIEW COMPARISON:  Radiographs of December 23, 2016. FINDINGS: Status post coronary artery bypass graft. Endotracheal and nasogastric tubes have been removed. Right internal jugular Swan-Ganz catheter is noted with distal tip in expected position of SVC. Left-sided chest tube is noted without pneumothorax. No pleural effusion is noted. No acute pulmonary disease is noted. Bony thorax is unremarkable. IMPRESSION: Endotracheal and nasogastric tubes have been removed. No pneumothorax is seen. Electronically Signed   By: Lupita Raider, M.D.   On: 12/24/2016 07:28     Assessment/Plan: S/P Procedure(s) (LRB): CORONARY ARTERY BYPASS GRAFTING (CABG) x four using left internal mammary artery and right greater saphenous leg vein using endoscope.  Lima-LAD, SVG-PD, SVG-OM, SVG-Diag. (N/A) TRANSESOPHAGEAL ECHOCARDIOGRAM (TEE) (N/A) Mobilize Diuresis Diabetes control d/c tubes/lines Continue foley due to strict I&O and patient in ICU See progression orders Expected Acute  Blood - loss Anemia    Delight Ovens 12/24/2016 7:37 AM

## 2016-12-24 NOTE — Progress Notes (Signed)
      [address]       [place] [address]             [phone]      POD # 2 CABG  Resting comfortably in bed  BP 112/74   Pulse 74   Temp 98.1 F (36.7 C) (Oral)   Resp 20   Ht 5\' 11"  (1.803 m)   Wt 247 lb 5.7 oz (112.2 kg)   SpO2 99%   BMI 34.50 kg/m    Intake/Output Summary (Last 24 hours) at 12/24/16 1738 Last data filed at 12/24/16 1100  Gross per 24 hour  Intake              589 ml  Output              820 ml  Net             -231 ml   Stable continue current care  Mayo Owczarzak C. Dorris Fetch, MD [facility] [phone]

## 2016-12-24 NOTE — Care Management Note (Signed)
Case Management Note  Patient Details  Name: Brad Olson MRN: [id number] Date of Birth: [birth year]  Subjective/Objective:        Pt is s/p CABG            Action/Plan:   PTA completely independent.  Pt states family will provide 24 hour supervision at discharge.  Cm will continue to follow for discharge needs   Expected Discharge Date:                  Expected Discharge Plan:  Home/Self Care  In-House Referral:     Discharge planning Services  CM Consult  Post Acute Care Choice:    Choice offered to:     DME Arranged:    DME Agency:     HH Arranged:    HH Agency:     Status of Service:  In process, will continue to follow  If discussed at Long Length of Stay Meetings, dates discussed:    Additional Comments:  Cherylann Parr, RN 12/24/2016, 9:38 AM

## 2016-12-24 NOTE — Discharge Summary (Signed)
Physician Discharge Summary  Patient ID: Brad Olson MRN: [id number] DOB/AGE: 1960/11/10 56 y.o.  Admit date: 12/20/2016 Discharge date: 12/29/2016  Admission Diagnoses:  Patient Active Problem List   Diagnosis Date Noted  . Tobacco abuse 12/21/2016  . Dyslipidemia 12/21/2016  . Cardiomyopathy (HCC) 12/21/2016  . NSTEMI (non-ST elevated myocardial infarction) (HCC) 12/20/2016  . Diabetes mellitus (HCC) 12/20/2016   Discharge Diagnoses:   Patient Active Problem List   Diagnosis Date Noted  . S/P CABG x 4 12/22/2016  . Tobacco abuse 12/21/2016  . Dyslipidemia 12/21/2016  . Cardiomyopathy (HCC) 12/21/2016  . NSTEMI (non-ST elevated myocardial infarction) (HCC) 12/20/2016  . Diabetes mellitus (HCC) 12/20/2016   Discharged Condition: good  History of Present Illness:  Brad Olson is a 56 yo male with known history of diabetes, hyperlipidemia, obesity, and tobacco abuse.  The patient developed chest pain about 2 weeks ago they have been increasing in intensity and frequency.  He presented to the ED where he found a mild elevation of his Troponin level.  Cardiology consult was obtained and they recommended transfer to [facility] for further care.  He underwent cardiac catheterization which revealed multivessel CAD.  He had a balloon pump placed during catheterization.  Echocardiogram was obtained and showed a reduced EF of 30% which was felt to be ischemic.  It was felt emergency coronary bypass grafting would be indicated and TCTS consult was requested.  The patient was evaluated by Dr. Tyrone Sage, who was in agreement, the risks and benefits of the procedure were explained to the patient and he was taken emergently to the OR.  Hospital Course:   He underwent CABG x 4 utilizing LIMA to LAD, SVG to PDA, SVG to OM, and SVG to Diagonal.  He also underwent endoscopic harvest of greater saphenous vein from right leg.  He tolerated the procedure without difficulty and was taken to  the SICU in stable condition.  During his stay in the SICU the patient was weaned and extubated on POD #1.  He was also weaned off his balloon pump on POD #1.  His chest tubes and arterial lines were removed without difficulty.  he was maintaining NSR.  He was ambuating in the SICU in stable condition.  He was felt medically stable for transfer to the step down unit on POD #2.  Once transferred he continued to progress. His rhythm is stable therefore we were able to discontinue the epicardial pacing wires. Today he is tolerating room air, his rhythm is stable, his incisions are healing well and he is ready for discharge. He is on Plavix and  A full ASA.   Significant Diagnostic Studies: angiography:    Ost LM lesion, 99 %stenosed.  1st Diag lesion, 60 %stenosed.  Dist LAD lesion, 70 %stenosed.  Prox RCA to Mid RCA lesion, 50 %stenosed.  Dist RCA lesion, 80 %stenosed.  RPDA lesion, 50 %stenosed.  1st RPLB lesion, 80 %stenosed.  Post Atrio lesion, 70 %stenosed.   Severe multi vessel CAD with 99% ostial left main stenosis and evidence for coronary calcification with 60% diffuse stenosis in the first diagonal vessel of the LAD, diffuse 70% mid LAD stenoses; small left circumflex coronary artery without significant stenoses; and dominant RCA with 50% smooth proximal stenosis, 80% eccentric stenosis at the acute margin, 50% PDA stenosis, 80% diffuse inferior LV branch stenosis, and 70% stenosis prior to the PLA takeoff in a small caliber vessel.  Echo Doppler documentation of an ejection fraction of 30%.  LVEDP at  catheterization is 18 mm Hg.  Insertion of intra-aortic balloon pump in this patient with an ischemic cardiomyopathy and severe life-threatening critical coronary anatomy.  Irregularity of the infrarenal aorta.  There is no evidence for renal artery stenosis.  There is 40% stenosis proximally in the right common iliac artery and 99%  ostially in the right internal iliac artery.  40%  stenosis in the left internal iliac artery.  Treatments: surgery:   CORONARY ARTERY BYPASS GRAFTING (CABG) (N/A) TRANSESOPHAGEAL ECHOCARDIOGRAM (TEE) (N/A) ENDOSCOPIC GREATER SAPHENOUS VEIN RIGHT LEG(EVH) LIMA-LAD SVG-PD SVG-OM SVG-DIAG  Disposition: Home  Discharge Medications:  The patient has been discharged on:   1.Beta Blocker:  Yes [ x  ]                              No   [   ]                              If No, reason:  2.Ace Inhibitor/ARB: Yes [ x  ]                                     No  [    ]                                     If No, reason:  3.Statin:   Yes [x ]                  No  [   ]                  If No, reason:  4.Ecasa:  Yes  [ x  ]                  No   [   ]                  If No, reason:    Allergies as of 12/29/2016      Reactions   Nicotine Rash   Nicotine patch only.      Medication List    STOP taking these medications   pravastatin 40 MG tablet Commonly known as:  PRAVACHOL     TAKE these medications   aspirin 325 MG EC tablet Take 1 tablet (325 mg total) by mouth daily. What changed:  medication strength  how much to take   atorvastatin 80 MG tablet Commonly known as:  LIPITOR Take 1 tablet (80 mg total) by mouth daily at 6 PM.   clopidogrel 75 MG tablet Commonly known as:  PLAVIX Take 1 tablet (75 mg total) by mouth daily.   glyBURIDE 5 MG tablet Commonly known as:  DIABETA Take 5 mg by mouth 2 (two) times daily.   JARDIANCE 25 MG Tabs tablet Generic drug:  empagliflozin Take 25 mg by mouth daily.   lisinopril 20 MG tablet Commonly known as:  PRINIVIL,ZESTRIL Take 1 tablet (20 mg total) by mouth daily.   metoprolol tartrate 25 MG tablet Commonly known as:  LOPRESSOR Take 1 tablet (25 mg total) by mouth 2 (two) times daily.   multivitamin with minerals Tabs tablet Take 1 tablet by mouth daily.   omega-3  acid ethyl esters 1 g capsule Commonly known as:  LOVAZA Take 1 capsule (1 g total) by mouth 2  (two) times daily.   oxyCODONE 5 MG immediate release tablet Commonly known as:  Oxy IR/ROXICODONE Take 1 tablet (5 mg total) by mouth every 6 (six) hours as needed for severe pain.   ranitidine 75 MG tablet Commonly known as:  ZANTAC Take 150 mg by mouth every evening.   SLEEP AID 50 MG Caps Generic drug:  DiphenhydrAMINE HCl (Sleep) Take 50 mg by mouth at bedtime.      Follow-up Information    [facility] Follow up on 01/24/2017.   Specialty:  Cardiothoracic Surgery Why:  Appointment is at 1:00, please get CXR at 12:30 at [facility] located on first floor of our office building Contact information: [address] [place], [address] [place] [address] [phone]       Joni Reining, NP Follow up on 01/10/2017.   Specialties:  Nurse Practitioner, Radiology, Cardiology Why:  Appointment is at 1:30 Contact information: [address] [place] [address] [phone]        Vennie Homans, MD. Call in 1 day(s).   Contact information: [address] [place] [address] [phone]           Signed: Sharlene Dory 12/29/2016, 9:23 AM

## 2016-12-24 NOTE — Progress Notes (Signed)
Progress Note  Patient Name: Brad Olson Date of Encounter: 12/24/2016  Primary Cardiologist: Dr. Purvis Sheffield  Subjective   Just extubated Voice hoarse Spoke with son   Inpatient Medications    Scheduled Meds: . acetaminophen  1,000 mg Oral Q6H   Or  . acetaminophen (TYLENOL) oral liquid 160 mg/5 mL  1,000 mg Per Tube Q6H  . aspirin EC  325 mg Oral Daily   Or  . aspirin  324 mg Per Tube Daily  . atorvastatin  80 mg Oral q1800  . bisacodyl  10 mg Oral Daily   Or  . bisacodyl  10 mg Rectal Daily  . cefUROXime (ZINACEF)  IV  1.5 g Intravenous Q12H  . clopidogrel  75 mg Oral Daily  . docusate sodium  200 mg Oral Daily  . enoxaparin (LOVENOX) injection  40 mg Subcutaneous QHS  . famotidine  20 mg Oral Daily  . insulin aspart  0-24 Units Subcutaneous Q4H  . insulin aspart  0-24 Units Subcutaneous Q4H  . insulin detemir  20 Units Subcutaneous BID  . lisinopril  2.5 mg Oral Daily  . mouth rinse  15 mL Mouth Rinse BID  . metoprolol tartrate  12.5 mg Oral BID   Or  . metoprolol tartrate  12.5 mg Per Tube BID  . pantoprazole  40 mg Oral Daily  . sodium chloride flush  3 mL Intravenous Q12H  . sodium chloride flush  3 mL Intravenous Q12H   Continuous Infusions: . sodium chloride 10 mL/hr at 12/24/16 0600  . sodium chloride    . sodium chloride 10 mL/hr at 12/23/16 2000  . lactated ringers    . lactated ringers 20 mL/hr at 12/24/16 0600  . phenylephrine (NEO-SYNEPHRINE) Adult infusion Stopped (12/23/16 1500)   PRN Meds: sodium chloride, sodium chloride, acetaminophen, lactated ringers, metoprolol, morphine injection, ondansetron (ZOFRAN) IV, oxyCODONE, sodium chloride flush, sodium chloride flush, traMADol   Vital Signs    Vitals:   12/24/16 0600 12/24/16 0700 12/24/16 0800 12/24/16 0900  BP: 124/60  110/67   Pulse: 76  75 77  Resp: (!) 24  (!) 22 17  Temp: 97.9 F (36.6 C) 98.2 F (36.8 C) 97.5 F (36.4 C)   TempSrc:  Oral    SpO2: 97%  96% 98%  Weight:       Height:        Intake/Output Summary (Last 24 hours) at 12/24/16 0940 Last data filed at 12/24/16 1610  Gross per 24 hour  Intake          1155.65 ml  Output             1110 ml  Net            45.65 ml   Filed Weights   12/20/16 1803 12/20/16 2313 12/24/16 0500  Weight: 241 lb (109.3 kg) 240 lb (108.9 kg) 247 lb 5.7 oz (112.2 kg)    Telemetry    NSR - Personally Reviewed  Physical Exam   Affect appropriate Healthy:  appears stated age HEENT: central line voice hoarse  Neck supple with no adenopathy JVP normal no bruits no thyromegaly Lungs mild exo wheezing and good diaphragmatic motion Heart:  No rub post sternotomy  PMI normal Abdomen: benighn, BS positve, no tenderness, no AAA no bruit.  No HSM or HJR Distal pulses intact with no bruits Plus one  Edema SVG harvest  Neuro non-focal Skin warm and dry No muscular weakness Right IJ Chest tubes out RFA A  post IABP removal  Foley    Labs    Chemistry Recent Labs Lab 12/22/16 0745  12/23/16 0359 12/23/16 0615 12/23/16 1606 12/23/16 1616 12/24/16 0330  NA 138  < > 136 138  --  138 134*  K 4.5  < > 4.8 4.6  --  4.9 4.5  CL 104  < > 105 102  --  103 101  CO2 26  --  22  --   --   --  24  GLUCOSE 164*  < > 199* 174*  --  137* 163*  BUN 24*  < > 18 21*  --  27* 30*  CREATININE 1.01  < > 1.05 1.10 1.22 1.10 1.24  CALCIUM 8.7*  --  8.2*  --   --   --  8.5*  PROT  --   --  5.2*  --   --   --   --   ALBUMIN  --   --  3.4*  --   --   --   --   AST  --   --  60*  --   --   --   --   ALT  --   --  35  --   --   --   --   ALKPHOS  --   --  26*  --   --   --   --   BILITOT  --   --  1.2  --   --   --   --   GFRNONAA >60  --  >60  --  >60  --  >60  GFRAA >60  --  >60  --  >60  --  >60  ANIONGAP 8  --  9  --   --   --  9  < > = values in this interval not displayed.   Hematology Recent Labs Lab 12/23/16 0359  12/23/16 1606 12/23/16 1616 12/24/16 0330  WBC 12.3*  --  19.8*  --  16.9*  RBC 3.89*  --  3.98*   --  3.61*  HGB 12.7*  < > 12.6* 12.9* 11.5*  HCT 36.8*  < > 38.5* 38.0* 34.6*  MCV 94.6  --  96.7  --  95.8  MCH 32.6  --  31.7  --  31.9  MCHC 34.5  --  32.7  --  33.2  RDW 13.4  --  13.5  --  13.6  PLT 104*  --  111*  --  89*  < > = values in this interval not displayed.  Cardiac Enzymes  Recent Labs Lab 12/20/16 2327 12/21/16 0526  TROPONINI 0.47* 0.33*     Recent Labs Lab 12/20/16 1925  TROPIPOC 0.30*     BNPNo results for input(s): BNP, PROBNP in the last 168 hours.   DDimer   Recent Labs Lab 12/21/16 0526  DDIMER <0.27     Radiology    Dg Chest Port 1 View  Result Date: 12/24/2016 CLINICAL DATA:  Postop status. EXAM: PORTABLE CHEST 1 VIEW COMPARISON:  Radiographs of December 23, 2016. FINDINGS: Status post coronary artery bypass graft. Endotracheal and nasogastric tubes have been removed. Right internal jugular Swan-Ganz catheter is noted with distal tip in expected position of SVC. Left-sided chest tube is noted without pneumothorax. No pleural effusion is noted. No acute pulmonary disease is noted. Bony thorax is unremarkable. IMPRESSION: Endotracheal and nasogastric tubes have been removed. No pneumothorax is seen. Electronically Signed   By:  Lupita Raider, M.D.   On: 12/24/2016 07:28   Dg Chest Port 1 View  Result Date: 12/23/2016 CLINICAL DATA:  Status post CABG with intra-aortic balloon pump placement EXAM: PORTABLE CHEST 1 VIEW COMPARISON:  Portable chest x-ray of December 22, 2016 FINDINGS: The lungs remain mildly hypoinflated. The interstitial markings are mildly increased. The pulmonary vascularity is engorged. There is no pneumothorax nor significant pleural effusion. The cardiac silhouette is enlarged. The sternal wires are intact. The intra-aortic balloon pump marker appears to be in stable position just inferior to the aortic arch. The Swan-Ganz catheter tip projects in the proximal main pulmonary outflow tract. The endotracheal tube tip lies approximately  3.9 cm above the carina. The mediastinal drain and the left chest tube are in stable position. IMPRESSION: Mild CHF. No pneumothorax, pleural effusion, or pneumonia. The support tubes are in reasonable position. Electronically Signed   By: David  [place] M.D.   On: 12/23/2016 07:59   Dg Chest Port 1 View  Result Date: 12/22/2016 CLINICAL DATA:  Initial evaluation status post CABG. EXAM: PORTABLE CHEST 1 VIEW COMPARISON:  Prior radiograph from 12/20/2016. FINDINGS: Endotracheal tube in place with tip positioned approximately 4 cm above the carina. Enteric tube courses in the the abdomen, with side hole overlying the proximal stomach. Right IJ approach Swan-Ganz catheter in place with tip overlying the main pulmonary outflow tract. Median sternotomy wires with underlying CABG markers now evident. Possible mediastinal drain in place. Cardiomegaly noted. Compare widening of the mediastinum likely in part related AP technique, lordotic angulation common shallow lung inflation. Left-sided chest tube in place with tip overlying the left upper lobe. Lungs hypoinflated. Mild perihilar vascular congestion without overt pulmonary edema. Mild atelectasis at the retrocardiac left lower lobe. No focal infiltrates. No pleural effusion. No pneumothorax. No acute osseus abnormality. IMPRESSION: 1. Tip of the endotracheal tube 4 cm above the carina. Remaining support apparatus in satisfactory position as above. 2. Sequelae of interval CABG. 3. Cardiomegaly with mild perihilar vascular congestion without overt pulmonary edema. 4. Mild left basilar atelectasis. Electronically Signed   By: Rise Mu M.D.   On: 12/22/2016 21:52    Cardiac Studies   2D Echo 12/21/16 - Procedure narrative: Transthoracic echocardiography. Image   quality was adequate. Intravenous contrast (Definity) was   administered. - Left ventricle: The cavity size was normal. Wall thickness was   increased in a pattern of moderate LVH. Systolic  function was   severely reduced. The estimated ejection fraction was 30%.   Features are consistent with a pseudonormal left ventricular   filling pattern, with concomitant abnormal relaxation and   increased filling pressure (grade 2 diastolic dysfunction). - Regional wall motion abnormality: Hypokinesis of the apical   anterior, mid inferior, mid inferolateral, mid anterolateral,   apical lateral, and apical myocardium. - Aortic valve: Mildly calcified annulus. Trileaflet.  Patient Profile     56 y.o. male with DM, hyperlipidemia, 30 years of tobacco abuse. obesity and family history of CAD (father died of MI age 74 with first MI several years earlier) - presented to [facility] with 2 episodes of left sided chest pain/burning over the last week.  Assessment & Plan    1. Chest pain/NSTEMI - cath with LM and high grade RCA disease day one post emergent CABG A pacing BP better off neo  Chest tubes out.  IABP out and no hematoma  Telemetry with ST elevation but no chest pain and ECG yesterday looked fine observe   2. DCM:  Will need cardiac MRI 3 months post revascularization to risk stratify for AICD On beta blocker and ACE can titrate lisinopril before d/c if BP   3. Frequent PVCs/bigeminy/trigeminy - better pre cath with K repletion and beta blocker  Likely related to ischemia A pacing now   4. Hyperlipidemia - titrated statin to high intensity given DM and elevated troponin. Check baseline LFTs in AM. Trigs are high, suspect due to DM. Will need close OP f/u of DM.  5. Tobacco abuse - counseled on importance of cessation.   6. Diabetes mellitus - plan to resume meds once all procedures completed. Continue SSI.   Aprpeciate  CVTS timely care   [name]

## 2016-12-24 NOTE — Progress Notes (Signed)
ST elevation noted on bedside EKG.  Pt with no distress noted.  No c/o pain.  12 lead EKG complete.  Minimal elevation in L1 noted.  No other changes noted.  Will continue to monitor closely.

## 2016-12-24 NOTE — Progress Notes (Signed)
Inpatient Diabetes Program Recommendations  AACE/ADA: New Consensus Statement on Inpatient Glycemic Control (2015)  Target Ranges:  Prepandial:   less than 140 mg/dL      Peak postprandial:   less than 180 mg/dL (1-2 hours)      Critically ill patients:  140 - 180 mg/dL   Lab Results  Component Value Date   GLUCAP 195 (H) 12/24/2016   HGBA1C 8.5 (H) 12/21/2016    Review of Glycemic Control  Diabetes history: DM2 Outpatient Diabetes medications: Diabeta 5 mg bid + Jardiance 25 mg qd Current orders for Inpatient glycemic control: Levemir 20 units bid + 0-24 q 4 hrs.  Inpatient Diabetes Program Recommendations:  Received consult. Patient not able to speak to DM Coordinator today so will plan to speak to pt. Monday. Noted A1c 8.5. May consider adding Metformin on discharge. Will follow.  Thank you, Brad Olson. Talita Recht, RN, MSN, CDE  Diabetes Coordinator Inpatient Glycemic Control Team Team Pager [phone] (8am-5pm) 12/24/2016 2:29 PM

## 2016-12-24 NOTE — Addendum Note (Signed)
Addendum  created 12/24/16 1414 by Kipp Brood, MD   Sign clinical note

## 2016-12-24 NOTE — Progress Notes (Signed)
Anesthesiology Follow-up:  Awake and alert, neuro intact. In good spirits, walking with assistance.  VS: T- 36.6 BP-112/74 HR 75 (SR) RR- 20 O2 Sat 99%  Glucose- 197,Na- 134 K- 4.5 BUN/Cr.- 30/1.24 H/H- 11.5/34.6 Platelets- 89,000  Extubated 11 hours post-op  56 year old male with smoking hx and Type 2 DM underwent emergency CABG X 4 on 4/11 for non-stemi with critical LM disease. Patient had moderate LV systolic dysfunction and hypoglycemia intra-operatively. Now doing well, not requiring supplemental glucose. Stable post-op course.  Brad Olson

## 2016-12-25 ENCOUNTER — Inpatient Hospital Stay (HOSPITAL_COMMUNITY): Payer: BLUE CROSS/BLUE SHIELD

## 2016-12-25 DIAGNOSIS — Z720 Tobacco use: Secondary | ICD-10-CM

## 2016-12-25 LAB — GLUCOSE, CAPILLARY
GLUCOSE-CAPILLARY: 180 mg/dL — AB (ref 65–99)
GLUCOSE-CAPILLARY: 131 mg/dL — AB (ref 65–99)
GLUCOSE-CAPILLARY: 152 mg/dL — AB (ref 65–99)
GLUCOSE-CAPILLARY: 202 mg/dL — AB (ref 65–99)
GLUCOSE-CAPILLARY: 146 mg/dL — AB (ref 65–99)
Glucose-Capillary: 125 mg/dL — ABNORMAL HIGH (ref 65–99)
Glucose-Capillary: 131 mg/dL — ABNORMAL HIGH (ref 65–99)
Glucose-Capillary: 131 mg/dL — ABNORMAL HIGH (ref 65–99)

## 2016-12-25 LAB — CBC
HEMATOCRIT: 31 % — AB (ref 39.0–52.0)
Hemoglobin: 10.2 g/dL — ABNORMAL LOW (ref 13.0–17.0)
MCH: 31.6 pg (ref 26.0–34.0)
MCHC: 32.9 g/dL (ref 30.0–36.0)
MCV: 96 fL (ref 78.0–100.0)
Platelets: 87 10*3/uL — ABNORMAL LOW (ref 150–400)
RBC: 3.23 MIL/uL — ABNORMAL LOW (ref 4.22–5.81)
RDW: 13.5 % (ref 11.5–15.5)
WBC: 12.7 10*3/uL — ABNORMAL HIGH (ref 4.0–10.5)

## 2016-12-25 LAB — BASIC METABOLIC PANEL
Anion gap: 8 (ref 5–15)
BUN: 38 mg/dL — ABNORMAL HIGH (ref 6–20)
CO2: 27 mmol/L (ref 22–32)
Calcium: 8.4 mg/dL — ABNORMAL LOW (ref 8.9–10.3)
Chloride: 98 mmol/L — ABNORMAL LOW (ref 101–111)
Creatinine, Ser: 1.11 mg/dL (ref 0.61–1.24)
GFR calc Af Amer: >60 mL/min (ref >60)
GFR calc non Af Amer: >60 mL/min (ref >60)
Glucose, Bld: 126 mg/dL — ABNORMAL HIGH (ref 65–99)
Potassium: 3.9 mmol/L (ref 3.5–5.1)
Sodium: 133 mmol/L — ABNORMAL LOW (ref 135–145)

## 2016-12-25 MED ORDER — POTASSIUM CHLORIDE CRYS ER 20 MEQ PO TBCR
20.0000 meq | EXTENDED_RELEASE_TABLET | Freq: Every day | ORAL | Status: DC
  Administered 2016-12-25 – 2016-12-29 (×5): 20 meq via ORAL
  Filled 2016-12-25 (×5): qty 1

## 2016-12-25 MED ORDER — FUROSEMIDE 40 MG PO TABS
40.0000 mg | ORAL_TABLET | Freq: Every day | ORAL | Status: DC
  Administered 2016-12-25 – 2016-12-29 (×5): 40 mg via ORAL
  Filled 2016-12-25 (×5): qty 1

## 2016-12-25 MED ORDER — INSULIN ASPART 100 UNIT/ML ~~LOC~~ SOLN
0.0000 [IU] | Freq: Three times a day (TID) | SUBCUTANEOUS | Status: DC
Start: 2016-12-25 — End: 2016-12-29
  Administered 2016-12-25: 2 [IU] via SUBCUTANEOUS
  Administered 2016-12-25 – 2016-12-26 (×2): 3 [IU] via SUBCUTANEOUS
  Administered 2016-12-26: 6 [IU] via SUBCUTANEOUS
  Administered 2016-12-26: 2 [IU] via SUBCUTANEOUS
  Administered 2016-12-27 (×2): 3 [IU] via SUBCUTANEOUS
  Administered 2016-12-28: 2 [IU] via SUBCUTANEOUS
  Administered 2016-12-29: 3 [IU] via SUBCUTANEOUS
  Administered 2016-12-29: 2 [IU] via SUBCUTANEOUS

## 2016-12-25 MED ORDER — METOCLOPRAMIDE HCL 10 MG PO TABS
10.0000 mg | ORAL_TABLET | Freq: Three times a day (TID) | ORAL | Status: AC
  Administered 2016-12-25 (×3): 10 mg via ORAL
  Filled 2016-12-25 (×3): qty 1

## 2016-12-25 NOTE — Op Note (Addendum)
NAMEREEVE, EWELL NO.:  [id number]  MEDICAL RECORD NO.:  [id number]  LOCATION:  2H16C                        FACILITY:  [facility]  PHYSICIAN:  Sheliah Plane, MD    DATE OF BIRTH:  March 16, 1961  DATE OF PROCEDURE:  12/22/2016 DATE OF DISCHARGE:                              OPERATIVE REPORT   PREOPERATIVE DIAGNOSIS:  Emergency coronary artery bypass grafting for severe 99% left main obstruction with LV dysfunction.  POSTOPERATIVE DIAGNOSIS:  Emergency coronary artery bypass grafting for severe 99% left main obstruction with LV dysfunction.  PROCEDURE:  Emergency coronary artery bypass grafting x4 with the left internal mammary to the left anterior descending coronary artery, reverse saphenous vein graft to the diagonal, reverse saphenous vein graft to the ramus intermediate, and reverse saphenous vein graft to the posterior descending with right thigh and calf endo vein harvesting of the greater saphenous vein.  SURGEON:  Sheliah Plane, M.D.  FIRST ASSISTANT:  Rowe Clack, PA-C.  BRIEF HISTORY:  The patient is a severely diabetic 56 year old male, who was admitted on December 20, 2016, with depressed LV function and chest discomfort.  On the day of surgery, he had undergone cardiac catheterization by Dr. Tresa Endo.  At the time of catheterization, the patient was noted to have a 99% left main obstruction and a severely diseased right coronary artery.  In addition, the patient had diffuse coronary artery disease in the distal vessels with his poorly controlled diabetes.  Overall ejection fraction was depressed at 30%, although the patient had poor distal targets.  His critical left main disease and presentation were of risk for sudden death.  It was recommended to he and his wife that we proceed with emergency coronary artery bypass grafting.  Intraaortic balloon pump had been placed by Dr. Tresa Endo.  The patient was awake and agreed to proceeding with emergent  surgery.  DESCRIPTION OF PROCEDURE:  He was taken directly from the cath lab to the operating room.  Swan-Ganz and arterial lines were placed.  The patient underwent general endotracheal anesthesia.  A TEE probe was placed by Dr. Laural Benes.  Skin of the chest and legs was prepped with Betadine and draped in the usual sterile manner.  An appropriate time- out was performed.  We then proceeded with endoscopic harvesting of the right greater saphenous vein , thigh and upper calf.  Vein was of good size and quality.  Median sternotomy was performed.  The left internal mammary artery was dissected down as a pedicle graft.  The distal artery was divided and was hydrostatically dilated with heparinized saline.  The patient was systemically heparinized.  The ascending aorta was cannulated.  The right atrium was cannulated and aortic root vent cardioplegia needle was introduced into the ascending aorta.  The patient was placed on cardiopulmonary bypass 2.4 L/min/m2.  First glucose in OR had been 47, followup glucose when on pump was 26. Additional routine glucose was administered and glucoses while on pump were closely followed.  Ultimately, the patient left the operating room on a D10 infusion.  Sites of anastomoses were identified and was noted on the patient's cath films.  He had very diffuse disease.  Aortic crossclamp was applied, 600 mL  of cold blood potassium cardioplegia was administered antegrade.  We turned our first attention to the posterior descending coronary artery, which was opened and was 1.2 mm in size with significant distal disease.  Using a running 7-0 Prolene, distal anastomosis was performed.  The heart was then elevated and the ramus branch supplying the lateral wall was opened and was a diffuse of moderate size, but a diffusely diseased vessel.  A 1 mm probe did pass distally.  Using a running 7-0 Prolene, distal anastomosis was performed.  The patient's circumflex proper  as it arose from the AV groove was a very small nonbypassable branches.  We then turned our attention to the highest diagonal coronary artery, which was opened and it admitted a 1 mm probe distally.  Using a running 7-0 Prolene, distal anastomosis was performed.  We then turned our attention to the left anterior descending coronary artery, which was also severely and diffusely diseased.  One probe passed distally, but would not pass proximally.  Using a running 8-0 Prolene, left internal mammary artery was anastomosed to left anterior descending coronary artery.  With the crossclamp still in place, three punch aortotomies were performed and each of the three vein grafts were anastomosed to the ascending aorta. Bulldog was removed from the mammary artery with prompt rise in myocardial septal temperature.  The heart was allowed to passively fill and de-air.  The aortic crossclamp was removed with total cross-clamp time of 112 minutes.  Sites of anastomosis were inspected and were free of bleeding.  The patient's body temperature was rewarmed to 37 degrees. He was started on dopamine infusion.  Atrial and ventricular pacing wires were applied.  The patient was then weaned from cardiopulmonary bypass.  An intraaortic balloon pump was placed on 1 to 1.  The patient remained hemodynamically stable.  He was decannulated in usual fashion. Protamine sulfate was administered with operative field hemostatic.  A left pleural tube and a Blake mediastinal drain were left in place. Pericardium was loosely reapproximated.  Sternum was closed with #6 stainless steel wire.  Fascia was closed with interrupted 0 Vicryl, running 3-0 Vicryl in subcutaneous tissue, 4-0 for subcuticular stitch in skin edges.  Dry dressings were applied.  Sponge and needle count was reported as correct at the completion of the procedure.  The patient did not require any blood bank or blood products during the operative procedure.   Total pump time was 151 minutes.  The patient was then transferred to the Surgical Intensive Care Unit for further postoperative care without obvious complications.  Glucose stabilized through the remainder of the case.  It should be noted that with the patient's very poor targets and distal disease that redo bypass surgery would not be a treatment option.     Sheliah Plane, MD     EG/MEDQ  D:  12/25/2016  T:  12/25/2016  Job:  [id number]

## 2016-12-25 NOTE — Progress Notes (Signed)
3 Days Post-Op Procedure(s) (LRB): CORONARY ARTERY BYPASS GRAFTING (CABG) x four using left internal mammary artery and right greater saphenous leg vein using endoscope.  Lima-LAD, SVG-PD, SVG-OM, SVG-Diag. (N/A) TRANSESOPHAGEAL ECHOCARDIOGRAM (TEE) (N/A) Subjective: Some nausea after walk this AM  Objective: Vital signs in last 24 hours: Temp:  [97.9 F (36.6 C)-98.3 F (36.8 C)] 98 F (36.7 C) (04/14 0758) Pulse Rate:  [68-85] 70 (04/14 0800) Cardiac Rhythm: Normal sinus rhythm (04/14 0800) Resp:  [14-23] 15 (04/14 0800) BP: (94-127)/(51-77) 117/60 (04/14 0800) SpO2:  [90 %-99 %] 99 % (04/14 0800) Weight:  [248 lb 7.3 oz (112.7 kg)] 248 lb 7.3 oz (112.7 kg) (04/14 0600)  Hemodynamic parameters for last 24 hours:    Intake/Output from previous day: 04/13 0701 - 04/14 0700 In: 1353 [P.O.:650; I.V.:613; IV Piggyback:50] Out: 920 [Urine:910; Chest Tube:10] Intake/Output this shift: Total I/O In: 20 [I.V.:20] Out: 100 [Urine:100]  General appearance: alert, cooperative and no distress Neurologic: intact Heart: regular rate and rhythm Lungs: diminished breath sounds bibasilar Abdomen: mildly distended, nontender Extremities: edema 2+  Lab Results:  Recent Labs  12/24/16 0330 12/25/16 0453  WBC 16.9* 12.7*  HGB 11.5* 10.2*  HCT 34.6* 31.0*  PLT 89* 87*   BMET:  Recent Labs  12/24/16 0330 12/25/16 0453  NA 134* 133*  K 4.5 3.9  CL 101 98*  CO2 24 27  GLUCOSE 163* 126*  BUN 30* 38*  CREATININE 1.24 1.11  CALCIUM 8.5* 8.4*    PT/INR:  Recent Labs  12/22/16 2111  LABPROT 16.0*  INR 1.27   ABG    Component Value Date/Time   PHART 7.329 (L) 12/23/2016 0922   HCO3 24.0 12/23/2016 0922   TCO2 24 12/23/2016 1616   ACIDBASEDEF 2.0 12/23/2016 0922   O2SAT 56.4 12/24/2016 0400   CBG (last 3)   Recent Labs  12/25/16 0039 12/25/16 0441 12/25/16 0746  GLUCAP 131* 125* 131*    Assessment/Plan: S/P Procedure(s) (LRB): CORONARY ARTERY BYPASS  GRAFTING (CABG) x four using left internal mammary artery and right greater saphenous leg vein using endoscope.  Lima-LAD, SVG-PD, SVG-OM, SVG-Diag. (N/A) TRANSESOPHAGEAL ECHOCARDIOGRAM (TEE) (N/A) -Awaiting transfer to stepdown CV- in SR  ASA, plavix, statin, ACE-I  RESP- continue IS for atelectasis  RENAL- creatinine and lytes Ok  PO lasix  ENDO- CBG controlled with current regimen, change to AC/HS  GI- still having some nausea- will give reglan PO x 24 hours  Continue cardiac rehab   LOS: 4 days    Loreli Slot 12/25/2016

## 2016-12-25 NOTE — Progress Notes (Signed)
      [address]       [place] [address]             [phone]      Up in chair  BP (!) 129/49 (BP Location: Right Arm)   Pulse 80   Temp 98 F (36.7 C) (Oral)   Resp 19   Ht 5\' 11"  (1.803 m)   Wt 248 lb 7.3 oz (112.7 kg)   SpO2 97%   BMI 34.65 kg/m    Intake/Output Summary (Last 24 hours) at 12/25/16 1736 Last data filed at 12/25/16 1600  Gross per 24 hour  Intake             1711 ml  Output              685 ml  Net             1026 ml    Ambulated today  Making slow but steady progress  Viviann Spare C. Dorris Fetch, MD [facility] [phone]

## 2016-12-25 NOTE — Progress Notes (Signed)
Progress Note  Patient Name: Brad Olson Date of Encounter: 12/25/2016  Primary Cardiologist: new Konoswaran  Subjective   56 yo admtted with STEMI. Hx of DM, hyperlipidemia, smoking , + family hx . Cath on 4/11 showed severe left main stenosis with moderate / severe disease elsewhere .  EF 30% .   Pt had emergent CABG on same day (4/11)  LIMA-LAD SVG-PD SVG-OM SVG-DIAG  Feeling better   Inpatient Medications    Scheduled Meds: . acetaminophen  1,000 mg Oral Q6H   Or  . acetaminophen (TYLENOL) oral liquid 160 mg/5 mL  1,000 mg Per Tube Q6H  . aspirin EC  325 mg Oral Daily   Or  . aspirin  324 mg Per Tube Daily  . atorvastatin  80 mg Oral q1800  . bisacodyl  10 mg Oral Daily   Or  . bisacodyl  10 mg Rectal Daily  . clopidogrel  75 mg Oral Daily  . docusate sodium  200 mg Oral Daily  . enoxaparin (LOVENOX) injection  40 mg Subcutaneous QHS  . famotidine  20 mg Oral Daily  . insulin aspart  0-24 Units Subcutaneous Q4H  . insulin detemir  20 Units Subcutaneous BID  . lisinopril  2.5 mg Oral Daily  . mouth rinse  15 mL Mouth Rinse BID  . metoprolol tartrate  12.5 mg Oral BID   Or  . metoprolol tartrate  12.5 mg Per Tube BID  . pantoprazole  40 mg Oral Daily  . sodium chloride flush  3 mL Intravenous Q12H  . sodium chloride flush  3 mL Intravenous Q12H   Continuous Infusions: . sodium chloride Stopped (12/24/16 1900)  . sodium chloride    . sodium chloride 10 mL/hr at 12/23/16 2000  . lactated ringers    . lactated ringers 20 mL/hr at 12/25/16 0800  . phenylephrine (NEO-SYNEPHRINE) Adult infusion Stopped (12/23/16 1500)   PRN Meds: sodium chloride, sodium chloride, acetaminophen, lactated ringers, metoprolol, morphine injection, ondansetron (ZOFRAN) IV, oxyCODONE, sodium chloride flush, sodium chloride flush, traMADol   Vital Signs    Vitals:   12/25/16 0600 12/25/16 0700 12/25/16 0758 12/25/16 0800  BP: (!) 109/51 121/71  117/60  Pulse:  71  70    Resp:  14  15  Temp:   98 F (36.7 C)   TempSrc:   Oral   SpO2: 95% 96%  99%  Weight: 248 lb 7.3 oz (112.7 kg)     Height:        Intake/Output Summary (Last 24 hours) at 12/25/16 0839 Last data filed at 12/25/16 0800  Gross per 24 hour  Intake             1373 ml  Output             1020 ml  Net              353 ml   Filed Weights   12/20/16 2313 12/24/16 0500 12/25/16 0600  Weight: 240 lb (108.9 kg) 247 lb 5.7 oz (112.2 kg) 248 lb 7.3 oz (112.7 kg)    Telemetry    NSR , mild persistent ST elevation  - Personally Reviewed  ECG    NSR  - Personally Reviewed  Physical Exam   GEN: No acute distress.  Examined sitting up in chair Neck: No JVD Cardiac: RRR,  Soft systolic murmur  Respiratory: Clear to auscultation bilaterally. GI:  moderately obese  MS: mild leg edema  Neuro:  Nonfocal  Psych:  Normal affect   Labs    Chemistry Recent Labs Lab 12/23/16 0359  12/23/16 1606 12/23/16 1616 12/24/16 0330 12/25/16 0453  NA 136  < >  --  138 134* 133*  K 4.8  < >  --  4.9 4.5 3.9  CL 105  < >  --  103 101 98*  CO2 22  --   --   --  24 27  GLUCOSE 199*  < >  --  137* 163* 126*  BUN 18  < >  --  27* 30* 38*  CREATININE 1.05  < > 1.22 1.10 1.24 1.11  CALCIUM 8.2*  --   --   --  8.5* 8.4*  PROT 5.2*  --   --   --   --   --   ALBUMIN 3.4*  --   --   --   --   --   AST 60*  --   --   --   --   --   ALT 35  --   --   --   --   --   ALKPHOS 26*  --   --   --   --   --   BILITOT 1.2  --   --   --   --   --   GFRNONAA >60  --  >60  --  >60 >60  GFRAA >60  --  >60  --  >60 >60  ANIONGAP 9  --   --   --  9 8  < > = values in this interval not displayed.   Hematology Recent Labs Lab 12/23/16 1606 12/23/16 1616 12/24/16 0330 12/25/16 0453  WBC 19.8*  --  16.9* 12.7*  RBC 3.98*  --  3.61* 3.23*  HGB 12.6* 12.9* 11.5* 10.2*  HCT 38.5* 38.0* 34.6* 31.0*  MCV 96.7  --  95.8 96.0  MCH 31.7  --  31.9 31.6  MCHC 32.7  --  33.2 32.9  RDW 13.5  --  13.6 13.5  PLT  111*  --  89* 87*    Cardiac Enzymes Recent Labs Lab 12/20/16 2327 12/21/16 0526  TROPONINI 0.47* 0.33*    Recent Labs Lab 12/20/16 1925  TROPIPOC 0.30*     BNPNo results for input(s): BNP, PROBNP in the last 168 hours.   DDimer  Recent Labs Lab 12/21/16 0526  DDIMER <0.27     Radiology    Dg Chest Port 1 View  Result Date: 12/25/2016 CLINICAL DATA:  Three days postop CABG. EXAM: PORTABLE CHEST 1 VIEW COMPARISON:  One-view chest x-ray 12/24/2016 FINDINGS: The Swan-Ganz catheter has been removed. A right IJ sheath remains. The left-sided chest tube has been removed. There is no pneumothorax. The mediastinal drain was removed. Lung volumes remain low. A small left pleural effusion is suspected. Mild left basilar atelectasis is noted. IMPRESSION: 1. Interval removal of right-sided chest tube, mediastinal drain, and Swan-Ganz catheter. 2. Low lung volumes with small left pleural effusion and associated atelectasis. 3. No pneumothorax. Electronically Signed   By: Marin Roberts M.D.   On: 12/25/2016 07:32   Dg Chest Port 1 View  Result Date: 12/24/2016 CLINICAL DATA:  Postop status. EXAM: PORTABLE CHEST 1 VIEW COMPARISON:  Radiographs of December 23, 2016. FINDINGS: Status post coronary artery bypass graft. Endotracheal and nasogastric tubes have been removed. Right internal jugular Swan-Ganz catheter is noted with distal tip in expected position of SVC. Left-sided chest tube is noted without pneumothorax.  No pleural effusion is noted. No acute pulmonary disease is noted. Bony thorax is unremarkable. IMPRESSION: Endotracheal and nasogastric tubes have been removed. No pneumothorax is seen. Electronically Signed   By: Lupita Raider, M.D.   On: 12/24/2016 07:28    Cardiac Studies      Patient Profile     56 y.o. male with CAD and CHF. s/p CABG   Assessment & Plan    1.  STEMI:   Due to severe stenosis of left main Overall doing well  Usual chest soreness.   2.  Acute  on chronic systolic CHF:  I suspect his EF will improve after CABG.  On Lisinopril 2.5 daily, metoprolol 12.5 po BID ,  Will continue to titreate upward as tolerated   3. Hyperlipidemia :   Currently on Atorvastatin 80 a day   4. DM :   Glucoses are fairly well controlled.   5. Cigarette smoking:  Encouraged cessation .   Signed, Kristeen Miss, MD  12/25/2016, 8:39 AM

## 2016-12-26 DIAGNOSIS — Z951 Presence of aortocoronary bypass graft: Secondary | ICD-10-CM

## 2016-12-26 DIAGNOSIS — E1159 Type 2 diabetes mellitus with other circulatory complications: Secondary | ICD-10-CM

## 2016-12-26 LAB — TYPE AND SCREEN
ABO/RH(D): O POS
Antibody Screen: NEGATIVE
UNIT DIVISION: 0
UNIT DIVISION: 0
Unit division: 0
Unit division: 0

## 2016-12-26 LAB — GLUCOSE, CAPILLARY
GLUCOSE-CAPILLARY: 130 mg/dL — AB (ref 65–99)
GLUCOSE-CAPILLARY: 165 mg/dL — AB (ref 65–99)
Glucose-Capillary: 163 mg/dL — ABNORMAL HIGH (ref 65–99)
Glucose-Capillary: 142 mg/dL — ABNORMAL HIGH (ref 65–99)

## 2016-12-26 LAB — BPAM RBC
BLOOD PRODUCT EXPIRATION DATE: 201805052359
Blood Product Expiration Date: 201805042359
Blood Product Expiration Date: 201805052359
Blood Product Expiration Date: 201805082359
UNIT TYPE AND RH: 5100
UNIT TYPE AND RH: 5100
Unit Type and Rh: 5100
Unit Type and Rh: 5100

## 2016-12-26 LAB — CBC
HCT: 29.8 % — ABNORMAL LOW (ref 39.0–52.0)
HEMOGLOBIN: 9.9 g/dL — AB (ref 13.0–17.0)
MCH: 32 pg (ref 26.0–34.0)
MCHC: 33.2 g/dL (ref 30.0–36.0)
MCV: 96.4 fL (ref 78.0–100.0)
Platelets: 112 10*3/uL — ABNORMAL LOW (ref 150–400)
RBC: 3.09 MIL/uL — AB (ref 4.22–5.81)
RDW: 14.1 % (ref 11.5–15.5)
WBC: 9.4 10*3/uL (ref 4.0–10.5)

## 2016-12-26 MED ORDER — METOCLOPRAMIDE HCL 10 MG PO TABS
10.0000 mg | ORAL_TABLET | Freq: Three times a day (TID) | ORAL | Status: AC
  Administered 2016-12-26 – 2016-12-27 (×4): 10 mg via ORAL
  Filled 2016-12-26 (×4): qty 1

## 2016-12-26 MED ORDER — METOPROLOL TARTRATE 25 MG PO TABS
25.0000 mg | ORAL_TABLET | Freq: Two times a day (BID) | ORAL | Status: DC
  Administered 2016-12-26 – 2016-12-29 (×5): 25 mg via ORAL
  Filled 2016-12-26 (×5): qty 1

## 2016-12-26 MED ORDER — ZOLPIDEM TARTRATE 5 MG PO TABS
5.0000 mg | ORAL_TABLET | Freq: Every evening | ORAL | Status: DC | PRN
  Administered 2016-12-26 – 2016-12-27 (×2): 5 mg via ORAL
  Filled 2016-12-26 (×2): qty 1

## 2016-12-26 MED ORDER — LISINOPRIL 5 MG PO TABS
5.0000 mg | ORAL_TABLET | Freq: Every day | ORAL | Status: DC
  Administered 2016-12-26 – 2016-12-27 (×2): 5 mg via ORAL
  Filled 2016-12-26 (×2): qty 1

## 2016-12-26 MED ORDER — METOPROLOL TARTRATE 25 MG/10 ML ORAL SUSPENSION
12.5000 mg | Freq: Two times a day (BID) | ORAL | Status: DC
  Administered 2016-12-27: 12.5 mg
  Filled 2016-12-26: qty 10

## 2016-12-26 NOTE — Progress Notes (Addendum)
Progress Note  Patient Name: Brad Olson Date of Encounter: 12/26/2016  Primary Cardiologist: new Konoswaran  Subjective   56 yo admtted with STEMI. Hx of DM, hyperlipidemia, smoking , + family hx . Cath on 4/11 showed severe left main stenosis with moderate / severe disease elsewhere .  EF 30% .   Pt had emergent CABG on same day (4/11)  LIMA-LAD SVG-PD SVG-OM SVG-DIAG  Feeling better   Inpatient Medications    Scheduled Meds: . acetaminophen  1,000 mg Oral Q6H   Or  . acetaminophen (TYLENOL) oral liquid 160 mg/5 mL  1,000 mg Per Tube Q6H  . aspirin EC  325 mg Oral Daily   Or  . aspirin  324 mg Per Tube Daily  . atorvastatin  80 mg Oral q1800  . bisacodyl  10 mg Oral Daily   Or  . bisacodyl  10 mg Rectal Daily  . clopidogrel  75 mg Oral Daily  . docusate sodium  200 mg Oral Daily  . enoxaparin (LOVENOX) injection  40 mg Subcutaneous QHS  . famotidine  20 mg Oral Daily  . furosemide  40 mg Oral Daily  . insulin aspart  0-15 Units Subcutaneous TID WC  . insulin detemir  20 Units Subcutaneous BID  . lisinopril  2.5 mg Oral Daily  . mouth rinse  15 mL Mouth Rinse BID  . metoprolol tartrate  12.5 mg Oral BID   Or  . metoprolol tartrate  12.5 mg Per Tube BID  . pantoprazole  40 mg Oral Daily  . potassium chloride  20 mEq Oral Daily  . sodium chloride flush  3 mL Intravenous Q12H  . sodium chloride flush  3 mL Intravenous Q12H   Continuous Infusions: . sodium chloride Stopped (12/24/16 1900)  . sodium chloride    . sodium chloride 10 mL/hr at 12/23/16 2000  . lactated ringers    . lactated ringers 20 mL/hr at 12/26/16 0800  . phenylephrine (NEO-SYNEPHRINE) Adult infusion Stopped (12/23/16 1500)   PRN Meds: sodium chloride, sodium chloride, acetaminophen, lactated ringers, metoprolol, morphine injection, ondansetron (ZOFRAN) IV, oxyCODONE, sodium chloride flush, sodium chloride flush, traMADol   Vital Signs    Vitals:   12/26/16 0500 12/26/16 0600  12/26/16 0700 12/26/16 0800  BP: 131/63  122/74 (!) 153/71  Pulse: 74 77 77 81  Resp: 15 19 13 19   Temp:   97.8 F (36.6 C)   TempSrc:   Oral   SpO2: 90% 92% 96% 95%  Weight:  244 lb 7.8 oz (110.9 kg)    Height:        Intake/Output Summary (Last 24 hours) at 12/26/16 0852 Last data filed at 12/26/16 0800  Gross per 24 hour  Intake              846 ml  Output             1500 ml  Net             -654 ml   Filed Weights   12/24/16 0500 12/25/16 0600 12/26/16 0600  Weight: 247 lb 5.7 oz (112.2 kg) 248 lb 7.3 oz (112.7 kg) 244 lb 7.8 oz (110.9 kg)    Telemetry    NSR , mild persistent ST elevation  - Personally Reviewed  ECG    NSR  - Personally Reviewed  Physical Exam   GEN: No acute distress.  Examined sitting up in chair Neck: No JVD Cardiac: RRR,  Soft systolic murmur  Respiratory:  Clear to auscultation bilaterally. GI:  moderately obese  MS: mild leg edema  Neuro:  Nonfocal  Psych: Normal affect   Labs    Chemistry Recent Labs Lab 12/23/16 0359  12/23/16 1606 12/23/16 1616 12/24/16 0330 12/25/16 0453  NA 136  < >  --  138 134* 133*  K 4.8  < >  --  4.9 4.5 3.9  CL 105  < >  --  103 101 98*  CO2 22  --   --   --  24 27  GLUCOSE 199*  < >  --  137* 163* 126*  BUN 18  < >  --  27* 30* 38*  CREATININE 1.05  < > 1.22 1.10 1.24 1.11  CALCIUM 8.2*  --   --   --  8.5* 8.4*  PROT 5.2*  --   --   --   --   --   ALBUMIN 3.4*  --   --   --   --   --   AST 60*  --   --   --   --   --   ALT 35  --   --   --   --   --   ALKPHOS 26*  --   --   --   --   --   BILITOT 1.2  --   --   --   --   --   GFRNONAA >60  --  >60  --  >60 >60  GFRAA >60  --  >60  --  >60 >60  ANIONGAP 9  --   --   --  9 8  < > = values in this interval not displayed.   Hematology  Recent Labs Lab 12/24/16 0330 12/25/16 0453 12/26/16 0356  WBC 16.9* 12.7* 9.4  RBC 3.61* 3.23* 3.09*  HGB 11.5* 10.2* 9.9*  HCT 34.6* 31.0* 29.8*  MCV 95.8 96.0 96.4  MCH 31.9 31.6 32.0  MCHC 33.2  32.9 33.2  RDW 13.6 13.5 14.1  PLT 89* 87* 112*    Cardiac Enzymes  Recent Labs Lab 12/20/16 2327 12/21/16 0526  TROPONINI 0.47* 0.33*     Recent Labs Lab 12/20/16 1925  TROPIPOC 0.30*     BNPNo results for input(s): BNP, PROBNP in the last 168 hours.   DDimer   Recent Labs Lab 12/21/16 0526  DDIMER <0.27     Radiology    Dg Chest Port 1 View  Result Date: 12/25/2016 CLINICAL DATA:  Three days postop CABG. EXAM: PORTABLE CHEST 1 VIEW COMPARISON:  One-view chest x-ray 12/24/2016 FINDINGS: The Swan-Ganz catheter has been removed. A right IJ sheath remains. The left-sided chest tube has been removed. There is no pneumothorax. The mediastinal drain was removed. Lung volumes remain low. A small left pleural effusion is suspected. Mild left basilar atelectasis is noted. IMPRESSION: 1. Interval removal of right-sided chest tube, mediastinal drain, and Swan-Ganz catheter. 2. Low lung volumes with small left pleural effusion and associated atelectasis. 3. No pneumothorax. Electronically Signed   By: Marin Roberts M.D.   On: 12/25/2016 07:32    Cardiac Studies      Patient Profile     56 y.o. male with CAD and CHF. s/p CABG   Assessment & Plan    1.  STEMI:   Due to severe stenosis of left main Overall doing well  Usual chest soreness.   2.  Acute on chronic systolic CHF:  I suspect his EF  will improve after CABG.  Now on Lisinopril 5 mg a day . Have increased the metoprolol to 25 BID  Continue to gradually titrate upward as needed / as tolerated.   3. Hyperlipidemia :   Currently on Atorvastatin 80 a day   4. DM :   Glucoses are fairly well controlled.  Continue current therapy. Recommend weight loss efforts also   5. Cigarette smoking:  Encouraged cessation .   Signed, Kristeen Miss, MD  12/26/2016, 8:52 AM

## 2016-12-26 NOTE — Progress Notes (Signed)
4 Days Post-Op Procedure(s) (LRB): CORONARY ARTERY BYPASS GRAFTING (CABG) x four using left internal mammary artery and right greater saphenous leg vein using endoscope.  Lima-LAD, SVG-PD, SVG-OM, SVG-Diag. (N/A) TRANSESOPHAGEAL ECHOCARDIOGRAM (TEE) (N/A) Subjective: Didn't sleep well last night. Got SOB on walk   Objective: Vital signs in last 24 hours: Temp:  [97.8 F (36.6 C)-99 F (37.2 C)] 97.8 F (36.6 C) (04/15 0700) Pulse Rate:  [69-82] 81 (04/15 0800) Cardiac Rhythm: Normal sinus rhythm (04/15 0800) Resp:  [11-23] 19 (04/15 0800) BP: (99-153)/(49-74) 153/71 (04/15 0800) SpO2:  [90 %-100 %] 95 % (04/15 0800) Weight:  [244 lb 7.8 oz (110.9 kg)] 244 lb 7.8 oz (110.9 kg) (04/15 0600)  Hemodynamic parameters for last 24 hours:    Intake/Output from previous day: 04/14 0701 - 04/15 0700 In: 846 [P.O.:360; I.V.:486] Out: 1600 [Urine:1600] Intake/Output this shift: Total I/O In: 20 [I.V.:20] Out: -   General appearance: alert, cooperative and no distress Neurologic: intact Heart: regular rate and rhythm Lungs: diminished breath sounds bibasilar Abdomen: normal findings: soft, non-tender Wound: clean and dry  Lab Results:  Recent Labs  12/25/16 0453 12/26/16 0356  WBC 12.7* 9.4  HGB 10.2* 9.9*  HCT 31.0* 29.8*  PLT 87* 112*   BMET:  Recent Labs  12/24/16 0330 12/25/16 0453  NA 134* 133*  K 4.5 3.9  CL 101 98*  CO2 24 27  GLUCOSE 163* 126*  BUN 30* 38*  CREATININE 1.24 1.11  CALCIUM 8.5* 8.4*    PT/INR: No results for input(s): LABPROT, INR in the last 72 hours. ABG    Component Value Date/Time   PHART 7.329 (L) 12/23/2016 0922   HCO3 24.0 12/23/2016 0922   TCO2 24 12/23/2016 1616   ACIDBASEDEF 2.0 12/23/2016 0922   O2SAT 56.4 12/24/2016 0400   CBG (last 3)   Recent Labs  12/25/16 1941 12/25/16 2149 12/26/16 0726  GLUCAP 202* 146* 130*    Assessment/Plan: S/P Procedure(s) (LRB): CORONARY ARTERY BYPASS GRAFTING (CABG) x four using  left internal mammary artery and right greater saphenous leg vein using endoscope.  Lima-LAD, SVG-PD, SVG-OM, SVG-Diag. (N/A) TRANSESOPHAGEAL ECHOCARDIOGRAM (TEE) (N/A) -CV- in SR, BP a little high- will increase lisinopril  RESP- continue IS  RENAL- continue diuresis with PO lasix  Dc Foley  ENDO- CBG moderately elevated, continue current regimen  Continue cardiac rehab   LOS: 5 days    Loreli Slot 12/26/2016

## 2016-12-27 ENCOUNTER — Inpatient Hospital Stay (HOSPITAL_COMMUNITY): Payer: BLUE CROSS/BLUE SHIELD

## 2016-12-27 LAB — CBC
HCT: 31.9 % — ABNORMAL LOW (ref 39.0–52.0)
HEMOGLOBIN: 10.4 g/dL — AB (ref 13.0–17.0)
MCH: 31.6 pg (ref 26.0–34.0)
MCHC: 32.6 g/dL (ref 30.0–36.0)
MCV: 97 fL (ref 78.0–100.0)
Platelets: 174 10*3/uL (ref 150–400)
RBC: 3.29 MIL/uL — AB (ref 4.22–5.81)
RDW: 13.6 % (ref 11.5–15.5)
WBC: 8.9 10*3/uL (ref 4.0–10.5)

## 2016-12-27 LAB — BASIC METABOLIC PANEL
ANION GAP: 10 (ref 5–15)
BUN: 35 mg/dL — AB (ref 6–20)
CO2: 25 mmol/L (ref 22–32)
Calcium: 8.6 mg/dL — ABNORMAL LOW (ref 8.9–10.3)
Chloride: 101 mmol/L (ref 101–111)
Creatinine, Ser: 1 mg/dL (ref 0.61–1.24)
GFR calc Af Amer: >60 mL/min (ref >60)
GFR calc non Af Amer: >60 mL/min (ref >60)
GLUCOSE: 233 mg/dL — AB (ref 65–99)
POTASSIUM: 4.5 mmol/L (ref 3.5–5.1)
Sodium: 136 mmol/L (ref 135–145)

## 2016-12-27 LAB — GLUCOSE, CAPILLARY
GLUCOSE-CAPILLARY: 109 mg/dL — AB (ref 65–99)
GLUCOSE-CAPILLARY: 104 mg/dL — AB (ref 65–99)
Glucose-Capillary: 152 mg/dL — ABNORMAL HIGH (ref 65–99)
Glucose-Capillary: 181 mg/dL — ABNORMAL HIGH (ref 65–99)

## 2016-12-27 MED ORDER — GLYBURIDE 5 MG PO TABS
5.0000 mg | ORAL_TABLET | Freq: Two times a day (BID) | ORAL | Status: DC
  Administered 2016-12-27 – 2016-12-29 (×5): 5 mg via ORAL
  Filled 2016-12-27 (×6): qty 1

## 2016-12-27 MED ORDER — DIPHENHYDRAMINE HCL 25 MG PO CAPS
50.0000 mg | ORAL_CAPSULE | Freq: Every evening | ORAL | Status: DC | PRN
  Administered 2016-12-28: 50 mg via ORAL
  Filled 2016-12-27: qty 2

## 2016-12-27 MED ORDER — CANAGLIFLOZIN 100 MG PO TABS
100.0000 mg | ORAL_TABLET | Freq: Every day | ORAL | Status: DC
  Administered 2016-12-27 – 2016-12-29 (×3): 100 mg via ORAL
  Filled 2016-12-27 (×4): qty 1

## 2016-12-27 NOTE — Progress Notes (Signed)
Pt bed alarm going off; entered pt's room to find pt standing beside bed without hospital gown and disconnected from all monitoring equipment. Pt attempting to urinate. Assessed pt's orientation level, pt alert but impulsive. Educated pt on the importance of calling staff for assistance out of bed and on the importance of sternal precautions. Pt verbalized understanding. Assisted pt to void in urinal, assisted pt back to bed, changed hospital gown, and reconnected to all monitoring equipment. Will continue to closely monitor pt.  Herma Ard, RN

## 2016-12-27 NOTE — Progress Notes (Signed)
5 Days Post-Op Procedure(s) (LRB): CORONARY ARTERY BYPASS GRAFTING (CABG) x four using left internal mammary artery and right greater saphenous leg vein using endoscope.  Lima-LAD, SVG-PD, SVG-OM, SVG-Diag. (N/A) TRANSESOPHAGEAL ECHOCARDIOGRAM (TEE) (N/A) Subjective: Upset this morning c/o pain, says he has been waiting 2 hours for pain med  Objective: Vital signs in last 24 hours: Temp:  [98.7 F (37.1 C)-99.2 F (37.3 C)] 99 F (37.2 C) (04/16 0500) Pulse Rate:  [29-101] 88 (04/16 0700) Cardiac Rhythm: Normal sinus rhythm (04/16 0400) Resp:  [14-21] 21 (04/16 0700) BP: (104-170)/(51-92) 131/67 (04/16 0700) SpO2:  [81 %-100 %] 92 % (04/16 0700) Weight:  [229 lb 15 oz (104.3 kg)] 229 lb 15 oz (104.3 kg) (04/16 0300)  Hemodynamic parameters for last 24 hours:    Intake/Output from previous day: 04/15 0701 - 04/16 0700 In: 483 [P.O.:480; I.V.:3] Out: 2050 [Urine:2050] Intake/Output this shift: No intake/output data recorded.  General appearance: alert, cooperative and no distress Neurologic: intact Heart: regular rate and rhythm Lungs: diminished breath sounds bibasilar Abdomen: normal findings: soft, non-tender Wound: chest - clean and dry, groin ecchymosis  Lab Results:  Recent Labs  12/25/16 0453 12/26/16 0356  WBC 12.7* 9.4  HGB 10.2* 9.9*  HCT 31.0* 29.8*  PLT 87* 112*   BMET:  Recent Labs  12/25/16 0453  NA 133*  K 3.9  CL 98*  CO2 27  GLUCOSE 126*  BUN 38*  CREATININE 1.11  CALCIUM 8.4*    PT/INR: No results for input(s): LABPROT, INR in the last 72 hours. ABG    Component Value Date/Time   PHART 7.329 (L) 12/23/2016 0922   HCO3 24.0 12/23/2016 0922   TCO2 24 12/23/2016 1616   ACIDBASEDEF 2.0 12/23/2016 0922   O2SAT 56.4 12/24/2016 0400   CBG (last 3)   Recent Labs  12/26/16 1131 12/26/16 1618 12/26/16 2114  GLUCAP 163* 165* 142*    Assessment/Plan: S/P Procedure(s) (LRB): CORONARY ARTERY BYPASS GRAFTING (CABG) x four using left  internal mammary artery and right greater saphenous leg vein using endoscope.  Lima-LAD, SVG-PD, SVG-OM, SVG-Diag. (N/A) TRANSESOPHAGEAL ECHOCARDIOGRAM (TEE) (N/A) POD # 5   CV- stable  RESP- on room air  RENAL- creatinine oK, weight down, continue PO lasix  ENDO- CBG reasonable control- restart PO meds, dc levemir, continue SSI  Continue cardiac rehab   LOS: 6 days    Loreli Slot 12/27/2016

## 2016-12-27 NOTE — Progress Notes (Signed)
On shift change, entered pt room with oncoming RN and Comptroller. Pt became agitated, verbally abusive, and attempted to stand on his own, despite having been informed previously that he should call for assistance d/t sternal precautions. Pt stated that he had been sitting in the chair for almost 2 hours and wanted "some f---ing pain medicine." Pt was assisted to chair at 0600. This agitation was a change in mental status from earlier. When asked by this RN why he did not use the call bell, he stated that he could not find it; however, pt's call bell was within reach. Attempted to educate patient on the need to sit in the chair after cardiac surgery and informed him that we could bring him pain medicine. Repositioned pt in chair with oncoming RN and pain meds given (see eMAR). Pt agreed to sit in chair until after breakfast.  MD at bedside and aware of these events.  Herma Ard, RN

## 2016-12-27 NOTE — Progress Notes (Signed)
Inpatient Diabetes Program Recommendations  AACE/ADA: New Consensus Statement on Inpatient Glycemic Control (2015)  Target Ranges:  Prepandial:   less than 140 mg/dL      Peak postprandial:   less than 180 mg/dL (1-2 hours)      Critically ill patients:  140 - 180 mg/dL   Lab Results  Component Value Date   GLUCAP 181 (H) 12/27/2016   HGBA1C 8.5 (H) 12/21/2016    Review of Glycemic Control Inpatient Diabetes Program Recommendations:   Spoke with pt and wife @ bedside about A1C 8.5 (197 average BG over 2-3 months time) results with them and explained what an A1C is, basic pathophysiology of DM Type 2, basic home care, basic diabetes diet nutrition principles, importance of checking CBGs and maintaining good CBG control to prevent long-term and short-term complications. Reviewed signs and symptoms of hyperglycemia and hypoglycemia and how to treat hypoglycemia at home. Also reviewed blood sugar goals at home.  Patient has a meter and strips @ home and plans to check CBGs regularly. Patient and wife state plans to eat healthier and plans to go to Cardiac rehab as outpatient.  Thank you, Billy Fischer. Eduardo Wurth, RN, MSN, CDE  Diabetes Coordinator Inpatient Glycemic Control Team Team Pager [phone] (8am-5pm) 12/27/2016 12:41 PM

## 2016-12-27 NOTE — Progress Notes (Signed)
Pt coughed and moderate amt of serosanguenous drainage leaked from sternal incision. Asa Lente PA  notified. Cleaned incision with betadine and covered with gauze. Will continue to monitor.

## 2016-12-27 NOTE — Progress Notes (Signed)
Progress Note  Patient Name: Brad Olson Date of Encounter: 12/27/2016  Primary Cardiologist: new Konoswaran  Subjective   56 yo admtted with MI. Hx of DM, hyperlipidemia, smoking , + family hx . Cath on 4/11 showed severe left main stenosis with moderate / severe disease elsewhere .  EF 30% .   Pt had emergent CABG on same day (4/11)  LIMA-LAD SVG-PD SVG-OM SVG-DIAG  Some DOE; no chest pain   Inpatient Medications    Scheduled Meds: . acetaminophen  1,000 mg Oral Q6H   Or  . acetaminophen (TYLENOL) oral liquid 160 mg/5 mL  1,000 mg Per Tube Q6H  . aspirin EC  325 mg Oral Daily   Or  . aspirin  324 mg Per Tube Daily  . atorvastatin  80 mg Oral q1800  . bisacodyl  10 mg Oral Daily   Or  . bisacodyl  10 mg Rectal Daily  . clopidogrel  75 mg Oral Daily  . docusate sodium  200 mg Oral Daily  . enoxaparin (LOVENOX) injection  40 mg Subcutaneous QHS  . famotidine  20 mg Oral Daily  . furosemide  40 mg Oral Daily  . insulin aspart  0-15 Units Subcutaneous TID WC  . insulin detemir  20 Units Subcutaneous BID  . lisinopril  5 mg Oral Daily  . mouth rinse  15 mL Mouth Rinse BID  . metoCLOPramide  10 mg Oral TID AC & HS  . metoprolol tartrate  25 mg Oral BID   Or  . metoprolol tartrate  12.5 mg Per Tube BID  . pantoprazole  40 mg Oral Daily  . potassium chloride  20 mEq Oral Daily  . sodium chloride flush  3 mL Intravenous Q12H  . sodium chloride flush  3 mL Intravenous Q12H   Continuous Infusions: . sodium chloride Stopped (12/24/16 1900)  . sodium chloride    . sodium chloride 10 mL/hr at 12/23/16 2000  . lactated ringers    . lactated ringers 20 mL/hr at 12/26/16 0700  . phenylephrine (NEO-SYNEPHRINE) Adult infusion Stopped (12/23/16 1500)   PRN Meds: sodium chloride, sodium chloride, acetaminophen, lactated ringers, metoprolol, morphine injection, ondansetron (ZOFRAN) IV, oxyCODONE, sodium chloride flush, sodium chloride flush, traMADol, zolpidem    Vital Signs    Vitals:   12/27/16 0400 12/27/16 0500 12/27/16 0600 12/27/16 0700  BP: (!) 129/57 131/66 (!) 121/51 131/67  Pulse: 81 78 91 88  Resp: 17 14 (!) 21 (!) 21  Temp:  99 F (37.2 C)    TempSrc:  Oral    SpO2: 91% 92% 94% 92%  Weight:      Height:        Intake/Output Summary (Last 24 hours) at 12/27/16 0708 Last data filed at 12/27/16 0600  Gross per 24 hour  Intake              483 ml  Output             2050 ml  Net            -1567 ml   Filed Weights   12/25/16 0600 12/26/16 0600 12/27/16 0300  Weight: 248 lb 7.3 oz (112.7 kg) 244 lb 7.8 oz (110.9 kg) 229 lb 15 oz (104.3 kg)    Telemetry    NSR , PVCs, PACs - Personally Reviewed  Physical Exam   GEN: No acute distress.  Examined sitting up in chair Neck: No JVD Cardiac: RRR Respiratory: CTA; chest s/p sternotomy GI:  NT/ND, no masses MS: 1+ leg edema  Neuro:  Grossly intact Psych: Normal affect   Labs    Chemistry Recent Labs Lab 12/23/16 0359  12/23/16 1606 12/23/16 1616 12/24/16 0330 12/25/16 0453  NA 136  < >  --  138 134* 133*  K 4.8  < >  --  4.9 4.5 3.9  CL 105  < >  --  103 101 98*  CO2 22  --   --   --  24 27  GLUCOSE 199*  < >  --  137* 163* 126*  BUN 18  < >  --  27* 30* 38*  CREATININE 1.05  < > 1.22 1.10 1.24 1.11  CALCIUM 8.2*  --   --   --  8.5* 8.4*  PROT 5.2*  --   --   --   --   --   ALBUMIN 3.4*  --   --   --   --   --   AST 60*  --   --   --   --   --   ALT 35  --   --   --   --   --   ALKPHOS 26*  --   --   --   --   --   BILITOT 1.2  --   --   --   --   --   GFRNONAA >60  --  >60  --  >60 >60  GFRAA >60  --  >60  --  >60 >60  ANIONGAP 9  --   --   --  9 8  < > = values in this interval not displayed.   Hematology  Recent Labs Lab 12/24/16 0330 12/25/16 0453 12/26/16 0356  WBC 16.9* 12.7* 9.4  RBC 3.61* 3.23* 3.09*  HGB 11.5* 10.2* 9.9*  HCT 34.6* 31.0* 29.8*  MCV 95.8 96.0 96.4  MCH 31.9 31.6 32.0  MCHC 33.2 32.9 33.2  RDW 13.6 13.5 14.1  PLT  89* 87* 112*    Cardiac Enzymes  Recent Labs Lab 12/20/16 2327 12/21/16 0526  TROPONINI 0.47* 0.33*     Recent Labs Lab 12/20/16 1925  TROPIPOC 0.30*     DDimer   Recent Labs Lab 12/21/16 0526  DDIMER <0.27    Patient Profile     56 y.o. male with CAD and CHF. s/p CABG   Assessment & Plan    1.  S/P MI:   Cath with severe CAD including LM; Continue ASA, plavix and statin.  2.  Ischemic cardiomyopathy: Change Lopressor to Toprol 25 mg daily. Continue lisinopril. Advance regimen as tolerated by pulse and blood pressure. Would plan to repeat echocardiogram in 3 months. Hopefully LV function will have improved following revascularization. If not would need to consider ICD.  3. Hyperlipidemia :   Continue Atorvastatin 80 a day   4. DM :   Follow CBG; continue present regimen.  5. Cigarette smoking:  Encouraged cessation.   6. Postoperative volume excess-continue present dose of Lasix.   Signed, Olga Millers, MD  12/27/2016, 7:08 AM

## 2016-12-27 NOTE — Progress Notes (Signed)
Pt has arrived to 2w from [facility]. Vitals obtained. Telemetry box applied and CCMD notified. Pt oriented to room.   Berdine Dance BSN, RN

## 2016-12-27 NOTE — Progress Notes (Signed)
CARDIAC REHAB PHASE I   PRE:  Rate/Rhythm: 79 SR  BP:  Supine:   Sitting: 120/54  Standing:    SaO2: 98%RA  MODE:  Ambulation: 460 ft   POST:  Rate/Rhythm: 81 SR  BP:  Supine:   Sitting: 150/54  Standing:    SaO2: 96%RA 1330-1425 Pt coughing constantly. Using heart pillow to hold against sternum. Productive phlegm at times. Pt walked 460 ft on RA with rolling walker and asst x 1 with steady gait. Walks bent over. Pt stated this was normal for him. Stopped frequently during walk to cough. Gave pillow each time. Stopped once to rest. Some DOE noted. To recliner after walk with call bell. Gave water several times during visit. Wife in room.   Luetta Nutting, RN BSN  12/27/2016 2:22 PM

## 2016-12-28 LAB — CBC
HCT: 31.6 % — ABNORMAL LOW (ref 39.0–52.0)
Hemoglobin: 10.4 g/dL — ABNORMAL LOW (ref 13.0–17.0)
MCH: 31.8 pg (ref 26.0–34.0)
MCHC: 32.9 g/dL (ref 30.0–36.0)
MCV: 96.6 fL (ref 78.0–100.0)
Platelets: 196 10*3/uL (ref 150–400)
RBC: 3.27 MIL/uL — AB (ref 4.22–5.81)
RDW: 13.9 % (ref 11.5–15.5)
WBC: 10.2 10*3/uL (ref 4.0–10.5)

## 2016-12-28 LAB — GLUCOSE, CAPILLARY
GLUCOSE-CAPILLARY: 135 mg/dL — AB (ref 65–99)
GLUCOSE-CAPILLARY: 127 mg/dL — AB (ref 65–99)
Glucose-Capillary: 112 mg/dL — ABNORMAL HIGH (ref 65–99)
Glucose-Capillary: 139 mg/dL — ABNORMAL HIGH (ref 65–99)

## 2016-12-28 MED ORDER — OMEGA-3-ACID ETHYL ESTERS 1 G PO CAPS
1.0000 g | ORAL_CAPSULE | Freq: Two times a day (BID) | ORAL | Status: DC
  Administered 2016-12-28 – 2016-12-29 (×3): 1 g via ORAL
  Filled 2016-12-28 (×3): qty 1

## 2016-12-28 MED ORDER — LISINOPRIL 10 MG PO TABS
20.0000 mg | ORAL_TABLET | Freq: Every day | ORAL | Status: DC
  Administered 2016-12-28 – 2016-12-29 (×2): 20 mg via ORAL
  Filled 2016-12-28 (×2): qty 2

## 2016-12-28 NOTE — Progress Notes (Signed)
Patient walked with the nurse for about 363ft this morning and tolerated well. Wife concerned patient did not sleep well. Asked for Ambien and it was administered with little result. Pain medication given as ordered.

## 2016-12-28 NOTE — Progress Notes (Signed)
CARDIAC REHAB PHASE I   PRE:  Rate/Rhythm: 82 SR  BP:  Supine:   Sitting: 128/59  Standing:    SaO2: 96%RA  MODE:  Ambulation: 550 ft   POST:  Rate/Rhythm: 82 SR  BP:  Supine:   Sitting: 150/54  Standing:    SaO2: 97%RA 1410-1458 Pt walked 550 ft on RA with rolling walker with minimal asst. Stood up straighter today and not coughing during walk. Tolerated well. Re enforced sternal precautions. Discussed CRP 2 and will refer closer to discharge.    Luetta Nutting, RN BSN  12/28/2016 2:54 PM

## 2016-12-28 NOTE — Progress Notes (Signed)
1100 Came at 1000 to walk with pt but he was getting ready to bathe. Saw pt at 1100 in hall way walking independently with walker and son. Will continue to follow. Luetta Nutting RN BSN 12/28/2016 11:09 AM

## 2016-12-28 NOTE — Progress Notes (Addendum)
[address]       [place] [address]             [phone]      6 Days Post-Op Procedure(s) (LRB): CORONARY ARTERY BYPASS GRAFTING (CABG) x four using left internal mammary artery and right greater saphenous leg vein using endoscope.  Lima-LAD, SVG-PD, SVG-OM, SVG-Diag. (N/A) TRANSESOPHAGEAL ECHOCARDIOGRAM (TEE) (N/A) Subjective: Feels fairly well, not sleeping well  Objective: Vital signs in last 24 hours: Temp:  [98 F (36.7 C)-98.4 F (36.9 C)] 98 F (36.7 C) (04/17 0242) Pulse Rate:  [81-87] 82 (04/17 0242) Cardiac Rhythm: Normal sinus rhythm (04/17 0700) Resp:  [16-22] 20 (04/17 0242) BP: (103-146)/(50-85) 127/56 (04/17 0242) SpO2:  [94 %-96 %] 94 % (04/17 0242) Weight:  [240 lb 4.8 oz (109 kg)] 240 lb 4.8 oz (109 kg) (04/17 0237)  Hemodynamic parameters for last 24 hours:    Intake/Output from previous day: 04/16 0701 - 04/17 0700 In: 243 [P.O.:240; I.V.:3] Out: 950 [Urine:950] Intake/Output this shift: No intake/output data recorded.  General appearance: alert, cooperative and no distress Heart: regular rate and rhythm Lungs: clear to auscultation bilaterally Abdomen: benign Extremities: + LE edema Wound: + sternal drainage, no erethema  Lab Results:  Recent Labs  12/27/16 0943 12/28/16 0203  WBC 8.9 10.2  HGB 10.4* 10.4*  HCT 31.9* 31.6*  PLT 174 196   BMET:  Recent Labs  12/27/16 0943  NA 136  K 4.5  CL 101  CO2 25  GLUCOSE 233*  BUN 35*  CREATININE 1.00  CALCIUM 8.6*    PT/INR: No results for input(s): LABPROT, INR in the last 72 hours. ABG    Component Value Date/Time   PHART 7.329 (L) 12/23/2016 0922   HCO3 24.0 12/23/2016 0922   TCO2 24 12/23/2016 1616   ACIDBASEDEF 2.0 12/23/2016 0922   O2SAT 56.4 12/24/2016 0400   CBG (last 3)   Recent Labs  12/27/16 1657 12/27/16 2040 12/28/16 0613  GLUCAP 109* 104* 112*    Meds Scheduled Meds: . aspirin EC  325 mg Oral Daily   Or  . aspirin  324 mg  Per Tube Daily  . atorvastatin  80 mg Oral q1800  . bisacodyl  10 mg Oral Daily   Or  . bisacodyl  10 mg Rectal Daily  . canagliflozin  100 mg Oral QAC breakfast  . clopidogrel  75 mg Oral Daily  . docusate sodium  200 mg Oral Daily  . enoxaparin (LOVENOX) injection  40 mg Subcutaneous QHS  . famotidine  20 mg Oral Daily  . furosemide  40 mg Oral Daily  . glyBURIDE  5 mg Oral BID WC  . insulin aspart  0-15 Units Subcutaneous TID WC  . lisinopril  5 mg Oral Daily  . mouth rinse  15 mL Mouth Rinse BID  . metoprolol tartrate  25 mg Oral BID   Or  . metoprolol tartrate  12.5 mg Per Tube BID  . pantoprazole  40 mg Oral Daily  . potassium chloride  20 mEq Oral Daily  . sodium chloride flush  3 mL Intravenous Q12H  . sodium chloride flush  3 mL Intravenous Q12H   Continuous Infusions: . sodium chloride Stopped (12/24/16 1900)  . sodium chloride    . sodium chloride 10 mL/hr at 12/23/16 2000  . lactated ringers    . lactated ringers 20 mL/hr at 12/26/16 0700  . phenylephrine (NEO-SYNEPHRINE) Adult infusion Stopped (12/23/16 1500)  PRN Meds:.sodium chloride, sodium chloride, acetaminophen, diphenhydrAMINE, lactated ringers, metoprolol, morphine injection, ondansetron (ZOFRAN) IV, oxyCODONE, sodium chloride flush, sodium chloride flush, traMADol, zolpidem  Xrays Dg Chest Port 1 View  Result Date: 12/27/2016 CLINICAL DATA:  Status post CABG five days ago EXAM: PORTABLE CHEST 1 VIEW COMPARISON:  Portable chest x-ray of December 25, 2016 FINDINGS: The lungs are reasonably well inflated. The pulmonary vascularity is less engorged. The cardiac silhouette remains enlarged. The left hemidiaphragm is now sharp. The intra-aortic balloon pump is not clearly demonstrated on today's study. There is calcification in the wall of the aortic arch. The sternal wires are intact. IMPRESSION: Improving CHF. Minimal interstitial edema and mild cardiomegaly persists. Decreased left lower lobe atelectasis.  Electronically Signed   By: David  [place] M.D.   On: 12/27/2016 07:15    Assessment/Plan: S/P Procedure(s) (LRB): CORONARY ARTERY BYPASS GRAFTING (CABG) x four using left internal mammary artery and right greater saphenous leg vein using endoscope.  Lima-LAD, SVG-PD, SVG-OM, SVG-Diag. (N/A) TRANSESOPHAGEAL ECHOCARDIOGRAM (TEE) (N/A) 1 doing well 2 hemodyn stable , elev BP- will increase lisinopril 3 CBC is stable , H/H -stable without leukocytosis 4 CBG ok on current rx- had a discussion about CHO/glucose restrictions and he understands 5 cont gentle diuresis for volume overload 6 he has significant hypertriglyceridemia in his lipid profile which is a significant sign of insulin resistance- will add fish oil, he is on statin 7 sternal drainage- no instability, appears to be fatty necrosis 8 d/c epw's  LOS: 7 days    GOLD,WAYNE E 12/28/2016 Patient seen and examined agree with above Agree likely fat necrosis accounting for sternal drainage, no erythema or induration- follow closely  Viviann Spare C. Dorris Fetch, MD [facility] [phone]

## 2016-12-28 NOTE — Progress Notes (Signed)
Removed wires without resistance at 1300 pt. Tolerated procedure well.

## 2016-12-29 LAB — CBC
HCT: 33.7 % — ABNORMAL LOW (ref 39.0–52.0)
Hemoglobin: 10.8 g/dL — ABNORMAL LOW (ref 13.0–17.0)
MCH: 30.8 pg (ref 26.0–34.0)
MCHC: 32 g/dL (ref 30.0–36.0)
MCV: 96 fL (ref 78.0–100.0)
Platelets: 259 10*3/uL (ref 150–400)
RBC: 3.51 MIL/uL — ABNORMAL LOW (ref 4.22–5.81)
RDW: 13.6 % (ref 11.5–15.5)
WBC: 10.6 10*3/uL — ABNORMAL HIGH (ref 4.0–10.5)

## 2016-12-29 LAB — GLUCOSE, CAPILLARY
GLUCOSE-CAPILLARY: 142 mg/dL — AB (ref 65–99)
GLUCOSE-CAPILLARY: 161 mg/dL — AB (ref 65–99)

## 2016-12-29 MED ORDER — ASPIRIN 325 MG PO TBEC: 325.0000 mg | DELAYED_RELEASE_TABLET | Freq: Every day | ORAL | 0 refills | Status: DC

## 2016-12-29 MED ORDER — LISINOPRIL 20 MG PO TABS: 20.0000 mg | ORAL_TABLET | Freq: Every day | ORAL | 1 refills | Status: DC

## 2016-12-29 MED ORDER — METOPROLOL TARTRATE 25 MG PO TABS: 25.0000 mg | ORAL_TABLET | Freq: Two times a day (BID) | ORAL | 1 refills | Status: DC

## 2016-12-29 MED ORDER — CLOPIDOGREL BISULFATE 75 MG PO TABS: 75.0000 mg | ORAL_TABLET | Freq: Every day | ORAL | 1 refills | Status: DC

## 2016-12-29 MED ORDER — OMEGA-3-ACID ETHYL ESTERS 1 G PO CAPS: 1.0000 g | ORAL_CAPSULE | Freq: Two times a day (BID) | ORAL | 1 refills | Status: DC

## 2016-12-29 MED ORDER — ATORVASTATIN CALCIUM 80 MG PO TABS: 80.0000 mg | ORAL_TABLET | Freq: Every day | ORAL | 1 refills | Status: DC

## 2016-12-29 MED ORDER — OXYCODONE HCL 5 MG PO TABS: 5.0000 mg | ORAL_TABLET | Freq: Four times a day (QID) | ORAL | 0 refills | Status: DC | PRN

## 2016-12-29 NOTE — Progress Notes (Signed)
      [address]       [place] [address]             [phone]      7 Days Post-Op Procedure(s) (LRB): CORONARY ARTERY BYPASS GRAFTING (CABG) x four using left internal mammary artery and right greater saphenous leg vein using endoscope.  Lima-LAD, SVG-PD, SVG-OM, SVG-Diag. (N/A) TRANSESOPHAGEAL ECHOCARDIOGRAM (TEE) (N/A) Subjective: Feels good this morning. Anxious to go home.   Objective: Vital signs in last 24 hours: Temp:  [98 F (36.7 C)-98.8 F (37.1 C)] 98 F (36.7 C) (04/18 0500) Pulse Rate:  [70-85] 70 (04/18 0500) Cardiac Rhythm: Normal sinus rhythm (04/18 0745) Resp:  [18] 18 (04/18 0500) BP: (99-135)/(50-87) 99/87 (04/18 0500) SpO2:  [95 %-98 %] 98 % (04/18 0500) Weight:  [107.5 kg (236 lb 14.4 oz)] 107.5 kg (236 lb 14.4 oz) (04/18 0500)     Intake/Output from previous day: 04/17 0701 - 04/18 0700 In: 720 [P.O.:720] Out: 450 [Urine:450] Intake/Output this shift: No intake/output data recorded.  General appearance: alert, cooperative and no distress Heart: regular rate and rhythm, S1, S2 normal, no murmur, click, rub or gallop Lungs: clear to auscultation bilaterally Abdomen: soft, non-tender; bowel sounds normal; no masses,  no organomegaly Extremities: extremities normal, atraumatic, no cyanosis or edema Wound: clean and dry  Lab Results:  Recent Labs  12/28/16 0203 12/29/16 0214  WBC 10.2 10.6*  HGB 10.4* 10.8*  HCT 31.6* 33.7*  PLT 196 259   BMET:  Recent Labs  12/27/16 0943  NA 136  K 4.5  CL 101  CO2 25  GLUCOSE 233*  BUN 35*  CREATININE 1.00  CALCIUM 8.6*    PT/INR: No results for input(s): LABPROT, INR in the last 72 hours. ABG    Component Value Date/Time   PHART 7.329 (L) 12/23/2016 0922   HCO3 24.0 12/23/2016 0922   TCO2 24 12/23/2016 1616   ACIDBASEDEF 2.0 12/23/2016 0922   O2SAT 56.4 12/24/2016 0400   CBG (last 3)   Recent Labs  12/28/16 1648 12/28/16 2143 12/29/16 0643  GLUCAP 135* 127*  142*    Assessment/Plan: S/P Procedure(s) (LRB): CORONARY ARTERY BYPASS GRAFTING (CABG) x four using left internal mammary artery and right greater saphenous leg vein using endoscope.  Lima-LAD, SVG-PD, SVG-OM, SVG-Diag. (N/A) TRANSESOPHAGEAL ECHOCARDIOGRAM (TEE) (N/A)  1. CV-NSR in the 80s. EPW removed. BP okay. Continue metoprolol, lisinopril, and lipitor 2. Pulm-tolerating room air without issue. Continue incentive spirometry 3. Renal-creatinine stable. Weight is now below baseline. Good urine output. Can discontinue diuretics.  4. Endo-blood glucose level well controlled on current regimen. A1C is 8.5. Will need close outpatient follow-up. On multiple agents at home.  5. Sternal incision-small amount of clear drainage on the distal end of the incision. No erythema.   Plan: Likely home today. Will need good follow-up for diabetes management. Patient aware.    LOS: 8 days    Brad Olson 12/29/2016

## 2016-12-29 NOTE — Progress Notes (Signed)
Chest tube suture removed. Pt tolerated well.

## 2016-12-29 NOTE — Progress Notes (Signed)
Discharge instructions given. Pt verbalized understanding and all questions were answered.  

## 2016-12-29 NOTE — Progress Notes (Addendum)
[id number] Education completed with pt and wife who voiced understanding. Reviewed sternal precautions and IS. Reviewed carb counting and heart healthy eating including watching sodium. Discussed CRP 2 and will refer to [facility] program. Pt does not need walker for home use. Gave walking instructions for ex. Offered to set up discharge video but not interested at this time   Discussed smoking cessation and gave handout and fake cigarette. Plans to quilt cold [place]. Luetta Nutting RN BSN 12/29/2016 11:30 AM

## 2016-12-29 NOTE — Discharge Instructions (Signed)
Coronary Artery Bypass Grafting, Care After ° °This sheet gives you information about how to care for yourself after your procedure. Your health care provider may also give you more specific instructions. If you have problems or questions, contact your health care provider. °What can I expect after the procedure? °After the procedure, it is common to have: °· Nausea and a lack of appetite. °· Constipation. °· Weakness and fatigue. °· Depression or irritability. °· Pain or discomfort in your incision areas. °Follow these instructions at home: °Medicines  °· Take over-the-counter and prescription medicines only as told by your health care provider. Do not stop taking medicines or start any new medicines without approval from your health care provider. °· If you were prescribed an antibiotic medicine, take it as told by your health care provider. Do not stop taking the antibiotic even if you start to feel better. °· Do not drive or use heavy machinery while taking prescription pain medicine. °Incision care  °· Follow instructions from your health care provider about how to take care of your incisions. Make sure you: °¨ Wash your hands with soap and water before you change your bandage (dressing). If soap and water are not available, use hand sanitizer. °¨ Change your dressing as told by your health care provider. °¨ Leave stitches (sutures), skin glue, or adhesive strips in place. These skin closures may need to stay in place for 2 weeks or longer. If adhesive strip edges start to loosen and curl up, you may trim the loose edges. Do not remove adhesive strips completely unless your health care provider tells you to do that. °· Keep incision areas clean, dry, and protected. °· Check your incision areas every day for signs of infection. Check for: °¨ More redness, swelling, or pain. °¨ More fluid or blood. °¨ Warmth. °¨ Pus or a bad smell. °· If incisions were made in your legs: °¨ Avoid crossing your legs. °¨ Avoid  sitting for long periods of time. Change positions every 30 minutes. °¨ Raise (elevate) your legs when you are sitting. °Bathing  °· Do not take baths, swim, or use a hot tub until your health care provider approves. °· Only take sponge baths. Pat the incisions dry. Do not rub incisions with a washcloth or towel. °· Ask your health care provider when you can shower. °Eating and drinking  °· Eat foods that are high in fiber, such as raw fruits and vegetables, whole grains, beans, and nuts. Meats should be lean cut. Avoid canned, processed, and fried foods. This can help prevent constipation and is a recommended part of a heart-healthy diet. °· Drink enough fluid to keep your urine clear or pale yellow. °· Limit alcohol intake to no more than 1 drink a day for nonpregnant women and 2 drinks a day for men. One drink equals 12 oz of beer, 5 oz of wine, or 1½ oz of hard liquor. °Activity  °· Rest and limit your activity as told by your health care provider. You may be instructed to: °¨ Stop any activity right away if you have chest pain, shortness of breath, irregular heartbeats, or dizziness. Get help right away if you have any of these symptoms. °¨ Move around frequently for short periods or take short walks as directed by your health care provider. Gradually increase your activities. You may need physical therapy or cardiac rehabilitation to help strengthen your muscles and build your endurance. °¨ Avoid lifting, pushing, or pulling anything that is heavier than 10   lb (4.5 kg) for at least 6 weeks or as told by your health care provider.  Do not drive until your health care provider approves.  Ask your health care provider when you may return to work.  Ask your health care provider when you may resume sexual activity. General instructions   Do not use any products that contain nicotine or tobacco, such as cigarettes and e-cigarettes. If you need help quitting, ask your health care provider.  Take 2-3 deep  breaths every few hours during the day, while you recover. This helps expand your lungs and prevent complications like pneumonia after surgery.  If you were given a device called an incentive spirometer, use it several times a day to practice deep breathing. Support your chest with a pillow or your arms when you take deep breaths or cough.  Wear compression stockings as told by your health care provider. These stockings help to prevent blood clots and reduce swelling in your legs.  Weigh yourself every day. This helps identify if your body is holding (retaining) fluid that may make your heart and lungs work harder.  Keep all follow-up visits as told by your health care provider. This is important. Contact a health care provider if:  You have more redness, swelling, or pain around any incision.  You have more fluid or blood coming from any incision.  Any incision feels warm to the touch.  You have pus or a bad smell coming from any incision  You have a fever.  You have swelling in your ankles or legs.  You have pain in your legs.  You gain 2 lb (0.9 kg) or more a day.  You are nauseous or you vomit.  You have diarrhea. Get help right away if:  You have chest pain that spreads to your jaw or arms.  You are short of breath.  You have a fast or irregular heartbeat.  You notice a "clicking" in your breastbone (sternum) when you move.  You have numbness or weakness in your arms or legs.  You feel dizzy or light-headed. Summary  After the procedure, it is common to have pain or discomfort in the incision areas.  Do not take baths, swim, or use a hot tub until your health care provider approves.  Gradually increase your activities. You may need physical therapy or cardiac rehabilitation to help strengthen your muscles and build your endurance.  Weigh yourself every day. This helps identify if your body is holding (retaining) fluid that may make your heart and lungs work  harder. This information is not intended to replace advice given to you by your health care provider. Make sure you discuss any questions you have with your health care provider. Document Released: 03/19/2005 Document Revised: 07/19/2016 Document Reviewed: 07/19/2016 Elsevier Interactive Patient Education  2017 [employer]   Endoscopic Saphenous Vein Harvesting, Care After  Refer to this sheet in the next few weeks. These instructions provide you with information about caring for yourself after your procedure. Your health care provider may also give you more specific instructions. Your treatment has been planned according to current medical practices, but problems sometimes occur. Call your health care provider if you have any problems or questions after your procedure. What can I expect after the procedure? After the procedure, it is common to have:  Pain.  Bruising.  Swelling.  Numbness. Follow these instructions at home: Medicine   Take over-the-counter and prescription medicines only as told by your health care provider.  Do not drive or operate heavy machinery while taking prescription pain medicine. Incision care    Follow instructions from your health care provider about how to take care of the cut made during surgery (incision). Make sure you:  Wash your hands with soap and water before you change your bandage (dressing). If soap and water are not available, use hand sanitizer.  Change your dressing as told by your health care provider.  Leave stitches (sutures), skin glue, or adhesive strips in place. These skin closures may need to be in place for 2 weeks or longer. If adhesive strip edges start to loosen and curl up, you may trim the loose edges. Do not remove adhesive strips completely unless your health care provider tells you to do that.  Check your incision area every day for signs of infection. Check for:  More redness, swelling, or pain.  More fluid or  blood.  Warmth.  Pus or a bad smell. General instructions   Raise (elevate) your legs above the level of your heart while you are sitting or lying down.  Do any exercises your health care providers have given you. These may include deep breathing, coughing, and walking exercises.  Do not shower, take baths, swim, or use a hot tub unless told by your health care provider.  Wear your elastic stocking if told by your health care provider.  Keep all follow-up visits as told by your health care provider. This is important. Contact a health care provider if:  Medicine does not help your pain.  Your pain gets worse.  You have new leg bruises or your leg bruises get bigger.  You have a fever.    Blood Glucose Monitoring, Adult   Monitoring your blood sugar (glucose) helps you manage your diabetes. It also helps you and your health care provider determine how well your diabetes management plan is working. Blood glucose monitoring involves checking your blood glucose as often as directed, and keeping a record (log) of your results over time. Why should I monitor my blood glucose? Checking your blood glucose regularly can:  Help you understand how food, exercise, illnesses, and medicines affect your blood glucose.  Let you know what your blood glucose is at any time. You can quickly tell if you are having low blood glucose (hypoglycemia) or high blood glucose (hyperglycemia).  Help you and your health care provider adjust your medicines as needed. When should I check my blood glucose? Follow instructions from your health care provider about how often to check your blood glucose. This may depend on:  The type of diabetes you have.  How well-controlled your diabetes is.  Medicines you are taking. If you have type 1 diabetes:   Check your blood glucose at least 2 times a day.  Also check your blood glucose:  Before every insulin injection.  Before and after  exercise.  Between meals.  2 hours after a meal.  Occasionally between 2:00 a.m. and 3:00 a.m., as directed.  Before potentially dangerous tasks, like driving or using heavy machinery.  At bedtime.  You may need to check your blood glucose more often, up to 6-10 times a day:  If you use an insulin pump.  If you need multiple daily injections (MDI).  If your diabetes is not well-controlled.  If you are ill.  If you have a history of severe hypoglycemia.  If you have a history of not knowing when your blood glucose is getting low (hypoglycemia unawareness). If you have  type 2 diabetes:   If you take insulin or other diabetes medicines, check your blood glucose at least 2 times a day.  If you are on intensive insulin therapy, check your blood glucose at least 4 times a day. Occasionally, you may also need to check between 2:00 a.m. and 3:00 a.m., as directed.  Also check your blood glucose:  Before and after exercise.  Before potentially dangerous tasks, like driving or using heavy machinery.  You may need to check your blood glucose more often if:  Your medicine is being adjusted.  Your diabetes is not well-controlled.  You are ill. What is a blood glucose log?  A blood glucose log is a record of your blood glucose readings. It helps you and your health care provider:  Look for patterns in your blood glucose over time.  Adjust your diabetes management plan as needed.  Every time you check your blood glucose, write down your result and notes about things that may be affecting your blood glucose, such as your diet and exercise for the day.  Most glucose meters store a record of glucose readings in the meter. Some meters allow you to download your records to a computer. How do I check my blood glucose? Follow these steps to get accurate readings of your blood glucose: Supplies needed    Blood glucose meter.  Test strips for your meter. Each meter has its own  strips. You must use the strips that come with your meter.  A needle to prick your finger (lancet). Do not use lancets more than once.  A device that holds the lancet (lancing device).  A journal or log book to write down your results. Procedure   Wash your hands with soap and water.  Prick the side of your finger (not the tip) with the lancet. Use a different finger each time.  Gently rub the finger until a small drop of blood appears.  Follow instructions that come with your meter for inserting the test strip, applying blood to the strip, and using your blood glucose meter.  Write down your result and any notes. Alternative testing sites   Some meters allow you to use areas of your body other than your finger (alternative sites) to test your blood.  If you think you may have hypoglycemia, or if you have hypoglycemia unawareness, do not use alternative sites. Use your finger instead.  Alternative sites may not be as accurate as the fingers, because blood flow is slower in these areas. This means that the result you get may be delayed, and it may be different from the result that you would get from your finger.  The most common alternative sites are:  Forearm.  Thigh.  Palm of the hand. Additional tips   Always keep your supplies with you.  If you have questions or need help, all blood glucose meters have a 24-hour hotline number that you can call. You may also contact your health care provider.  After you use a few boxes of test strips, adjust (calibrate) your blood glucose meter by following instructions that came with your meter. This information is not intended to replace advice given to you by your health care provider. Make sure you discuss any questions you have with your health care provider. Document Released: 09/02/2003 Document Revised: 03/19/2016 Document Reviewed: 02/09/2016 Elsevier Interactive Patient Education  2017 [employer].   Your leg feels  numb.  You have more redness, swelling, or pain around your incision.  You have more fluid or blood coming from your incision.  Your incision feels warm to the touch.  You have pus or a bad smell coming from your incision. Get help right away if:  Your pain is severe.  You develop pain, tenderness, warmth, redness, or swelling in any part of your leg.  You have chest pain.  You have trouble breathing. This information is not intended to replace advice given to you by your health care provider. Make sure you discuss any questions you have with your health care provider. Document Released: 05/12/2011 Document Revised: 02/05/2016 Document Reviewed: 07/14/2015 Elsevier Interactive Patient Education  2017 [employer].

## 2017-01-03 ENCOUNTER — Telehealth (HOSPITAL_COMMUNITY): Payer: Self-pay | Admitting: Physician Assistant

## 2017-01-03 NOTE — Telephone Encounter (Signed)
      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  Discharged home on 4/18  Medications: Current Outpatient Prescriptions on File Prior to Visit  Medication Sig Dispense Refill  . aspirin 325 MG EC tablet Take 1 tablet (325 mg total) by mouth daily. 30 tablet 0  . atorvastatin (LIPITOR) 80 MG tablet Take 1 tablet (80 mg total) by mouth daily at 6 PM. 30 tablet 1  . clopidogrel (PLAVIX) 75 MG tablet Take 1 tablet (75 mg total) by mouth daily. 30 tablet 1  . DiphenhydrAMINE HCl, Sleep, (SLEEP AID) 50 MG CAPS Take 50 mg by mouth at bedtime.    Marland Kitchen glyBURIDE (DIABETA) 5 MG tablet Take 5 mg by mouth 2 (two) times daily.  6  . JARDIANCE 25 MG TABS tablet Take 25 mg by mouth daily.  5  . lisinopril (PRINIVIL,ZESTRIL) 20 MG tablet Take 1 tablet (20 mg total) by mouth daily. 30 tablet 1  . metoprolol tartrate (LOPRESSOR) 25 MG tablet Take 1 tablet (25 mg total) by mouth 2 (two) times daily. 30 tablet 1  . Multiple Vitamin (MULTIVITAMIN WITH MINERALS) TABS tablet Take 1 tablet by mouth daily.    Marland Kitchen omega-3 acid ethyl esters (LOVAZA) 1 g capsule Take 1 capsule (1 g total) by mouth 2 (two) times daily. 30 capsule 1  . oxyCODONE (OXY IR/ROXICODONE) 5 MG immediate release tablet Take 1 tablet (5 mg total) by mouth every 6 (six) hours as needed for severe pain. 30 tablet 0  . ranitidine (ZANTAC) 75 MG tablet Take 150 mg by mouth every evening.     No current facility-administered medications on file prior to visit.     Coumadin:  INR check Yes/No  Problems/Concerns: I spoke with Brad Olson on the phone this afternoon. Overall he is doing quite well. He does share that he has had some issues with sleeping. He ended up buying a expensive recliner to try and sleep better at night. I suggested taking a pain pill  before bed in addition he may use over-the-counter sleep aids such as Benadryl or  melatonin. He shares that he will try these things. He has not had any shortness of breath and he has been able to get around okay. He says his pain is well controlled most of the time. He attributes his restlessness at night to his high stress job. He finds it hard to put aside work to heal. He knows about his appointment coming up in the next few weeks with our office. He plans to attend.  Assessment: The patient is doing quite well post CABG 4 surgery. He plans to follow up in a few weeks for his appointment. Contact office if concerns or problems develop.   Follow up Appointment:    [facility] Follow up on 01/24/2017.   Specialty:  Cardiothoracic Surgery Why:  Appointment is at 1:00, please get CXR at 12:30 at [facility] located on first floor of our office building Contact information: [address] [place], [address] [place] [address] [phone]    Jari Favre, [place]

## 2017-01-10 ENCOUNTER — Ambulatory Visit (INDEPENDENT_AMBULATORY_CARE_PROVIDER_SITE_OTHER): Payer: BLUE CROSS/BLUE SHIELD | Admitting: Adult Health

## 2017-01-10 ENCOUNTER — Encounter: Payer: Self-pay | Admitting: Adult Health

## 2017-01-10 VITALS — BP 112/60 | HR 65 | Ht 71.0 in | Wt 224.0 lb

## 2017-01-10 DIAGNOSIS — I428 Other cardiomyopathies: Secondary | ICD-10-CM

## 2017-01-10 DIAGNOSIS — E785 Hyperlipidemia, unspecified: Secondary | ICD-10-CM

## 2017-01-10 DIAGNOSIS — Z951 Presence of aortocoronary bypass graft: Secondary | ICD-10-CM | POA: Diagnosis not present

## 2017-01-10 DIAGNOSIS — I2510 Atherosclerotic heart disease of native coronary artery without angina pectoris: Secondary | ICD-10-CM

## 2017-01-10 DIAGNOSIS — Z720 Tobacco use: Secondary | ICD-10-CM | POA: Diagnosis not present

## 2017-01-10 NOTE — Patient Instructions (Signed)
Your physician wants you to follow-up in: 3 Months with Dr. Purvis Sheffield. You will receive a reminder letter in the mail two months in advance. If you don't receive a letter, please call our office to schedule the follow-up appointment.  Your physician recommends that you continue on your current medications as directed. Please refer to the Current Medication list given to you today.  You have been referred to Cardiac Rehab  Your physician recommends that you return for lab work in: 3 Months just before your next visit.   If you need a refill on your cardiac medications before your next appointment, please call your pharmacy.  Thank you for choosing [facility]!

## 2017-01-10 NOTE — Progress Notes (Signed)
Cardiology Office Note   Date:  01/10/2017   ID:  Brad Olson, DOB 06/30/1961, MRN [id number]  PCP:  PROVIDER NOT IN SYSTEM  Cardiologist:  Purvis Sheffield MD Chief Complaint  Patient presents with  . Hospitalization Follow-up  . Coronary Artery Disease      History of Present Illness: Brad Olson is a 56 y.o. male who presents for posthospitalization follow-up after admission for non-ST elevation MI, with history of cardiomyopathy, dyslipidemia, diabetes, and tobacco abuse. Initially presented with chest pain with increasing intensity and frequency. Cardiac catheterization was completed which revealed multivessel CAD, he required balloon pump placed during catheterization. Echocardiogram revealed EF of 30% which was felt to be ischemic. He was referred to Dr. Tyrone Sage, CVTS.  The patient underwent CABG 4 with LIMA to LAD, SVG to PDA, SVG to OM, and SVG to diagonal. He had harvest of the greater saphenous vein from right leg. He is continued on Plavix and aspirin.  He is here with multiple questions. He denies bleeding, significant chest pain, dizziness or dyspnea. He continues to use pillow for splinting with coughing.He complains of insomnia.  There is also some depression and tearfulness. He has stopped smoking.   Past Medical History:  Diagnosis Date  . Diabetes mellitus without complication (HCC)   . Hyperlipidemia   . Obesity   . Tobacco abuse     Past Surgical History:  Procedure Laterality Date  . ABDOMINAL AORTOGRAM N/A 12/22/2016   Procedure: Abdominal Aortogram;  Surgeon: Lennette Bihari, MD;  Location: [facility] INVASIVE CV LAB;  Service: Cardiovascular;  Laterality: N/A;  . COLON RESECTION     due to bowel rupture  . CORONARY ARTERY BYPASS GRAFT N/A 12/22/2016   Procedure: CORONARY ARTERY BYPASS GRAFTING (CABG) x four using left internal mammary artery and right greater saphenous leg vein using endoscope.  Lima-LAD, SVG-PD, SVG-OM, SVG-Diag.;  Surgeon: Delight Ovens,  MD;  Location: [facility] OR;  Service: Open Heart Surgery;  Laterality: N/A;  . IABP INSERTION N/A 12/22/2016   Procedure: IABP Insertion;  Surgeon: Lennette Bihari, MD;  Location: [facility] INVASIVE CV LAB;  Service: Cardiovascular;  Laterality: N/A;  . LEFT HEART CATH AND CORONARY ANGIOGRAPHY N/A 12/22/2016   Procedure: Left Heart Cath and Coronary Angiography;  Surgeon: Lennette Bihari, MD;  Location: [facility] INVASIVE CV LAB;  Service: Cardiovascular;  Laterality: N/A;  . TEE WITHOUT CARDIOVERSION N/A 12/22/2016   Procedure: TRANSESOPHAGEAL ECHOCARDIOGRAM (TEE);  Surgeon: Delight Ovens, MD;  Location: [facility] OR;  Service: Open Heart Surgery;  Laterality: N/A;     Current Outpatient Prescriptions  Medication Sig Dispense Refill  . aspirin 325 MG EC tablet Take 1 tablet (325 mg total) by mouth daily. 30 tablet 0  . atorvastatin (LIPITOR) 80 MG tablet Take 1 tablet (80 mg total) by mouth daily at 6 PM. 30 tablet 1  . clopidogrel (PLAVIX) 75 MG tablet Take 1 tablet (75 mg total) by mouth daily. 30 tablet 1  . DiphenhydrAMINE HCl, Sleep, (SLEEP AID) 50 MG CAPS Take 50 mg by mouth at bedtime.    Marland Kitchen doxylamine, Sleep, (UNISOM) 25 MG tablet Take 50 mg by mouth at bedtime as needed.    . glyBURIDE (DIABETA) 5 MG tablet Take 5 mg by mouth 2 (two) times daily.  6  . JARDIANCE 25 MG TABS tablet Take 25 mg by mouth daily.  5  . lisinopril (PRINIVIL,ZESTRIL) 20 MG tablet Take 1 tablet (20 mg total) by mouth daily. 30 tablet 1  . metoprolol tartrate (  LOPRESSOR) 25 MG tablet Take 1 tablet (25 mg total) by mouth 2 (two) times daily. 30 tablet 1  . Multiple Vitamin (MULTIVITAMIN WITH MINERALS) TABS tablet Take 1 tablet by mouth daily.    Marland Kitchen omega-3 acid ethyl esters (LOVAZA) 1 g capsule Take 1 capsule (1 g total) by mouth 2 (two) times daily. 30 capsule 1  . oxyCODONE (OXY IR/ROXICODONE) 5 MG immediate release tablet Take 1 tablet (5 mg total) by mouth every 6 (six) hours as needed for severe pain. 30 tablet 0  . ranitidine (ZANTAC)  75 MG tablet Take 150 mg by mouth every evening.     No current facility-administered medications for this visit.     Allergies:   Nicotine    Social History:  The patient  reports that he quit smoking about 3 weeks ago. He smoked 1.00 pack per day. He has never used smokeless tobacco. He reports that he drinks alcohol. He reports that he does not use drugs.   Family History:  The patient's family history includes CAD in his father; Lung disease in his father.    ROS: All other systems are reviewed and negative. Unless otherwise mentioned in H&P    PHYSICAL EXAM: VS:  BP 112/60   Pulse 65   Ht 5\' 11"  (1.803 m)   Wt 224 lb (101.6 kg)   SpO2 98%   BMI 31.24 kg/m  , BMI Body mass index is 31.24 kg/m. GEN: Well nourished, well developed, in no acute distress  HEENT: normal  Neck: no JVD, carotid bruits, or masses Cardiac: RRR; distant with occasional extra systole,  no murmurs, rubs, or gallops,no edema  Respiratory:  clear to auscultation bilaterally, normal work of breathing GI: soft, nontender, nondistended, + BS MS: no deformity or atrophy Well healed sternotomy without evidence of infection or drainage.  Skin: warm and dry, no rash Neuro:  Strength and sensation are intact Psych: euthymic mood, flat affect, sometime tearful.    Recent Labs: 12/23/2016: ALT 35; Magnesium 2.2 12/27/2016: BUN 35; Creatinine, Ser 1.00; Potassium 4.5; Sodium 136 12/29/2016: Hemoglobin 10.8; Platelets 259    Lipid Panel    Component Value Date/Time   CHOL 202 (H) 12/21/2016 0526   TRIG 444 (H) 12/21/2016 0526   HDL 37 (L) 12/21/2016 0526   CHOLHDL 5.5 12/21/2016 0526   VLDL UNABLE TO CALCULATE IF TRIGLYCERIDE OVER 400 mg/dL 16/06/9603 5409   LDLCALC UNABLE TO CALCULATE IF TRIGLYCERIDE OVER 400 mg/dL 81/19/1478 2956      Wt Readings from Last 3 Encounters:  01/10/17 224 lb (101.6 kg)  12/29/16 236 lb 14.4 oz (107.5 kg)      Other studies Reviewed: Significant Diagnostic Studies:  angiography:    Ost LM lesion, 99 %stenosed.  1st Diag lesion, 60 %stenosed.  Dist LAD lesion, 70 %stenosed.  Prox RCA to Mid RCA lesion, 50 %stenosed.  Dist RCA lesion, 80 %stenosed.  RPDA lesion, 50 %stenosed.  1st RPLB lesion, 80 %stenosed.  Post Atrio lesion, 70 %stenosed.  Severe multi vessel CAD with 99% ostial left main stenosis and evidence for coronary calcification with 60% diffuse stenosis in the first diagonal vessel of the LAD, diffuse 70% mid LAD stenoses; small left circumflex coronary artery without significant stenoses; and dominant RCA with 50% smooth proximal stenosis, 80% eccentric stenosis at the acute margin, 50% PDA stenosis, 80% diffuse inferior LV branch stenosis, and 70% stenosis prior to the PLA takeoff in a small caliber vessel.  ASSESSMENT AND PLAN:  1.  CAD:  Multivessel CAD, s/p CABG 4 with LIMA to LAD, SVG to PDA, SVG to OM, and SVG to diagonal. He continues to have some post-operative soreness and fatigue but is feeling better. His main complaint is insomnia. I have explained that this can be normal post-operatively. He may benefit from short term medication if this becomes more worrisome to him, and affecting his ability to function during the day. This may also be related to some depression. I have offered low dose zZoloft for situational depression. He refuses at this time. He is referred to cardiac rehab. Follow up labs in 3 months.  2. Tobacco abuse: He has stopped smoking.   3.  Diabetes: to be followed by PCP. Has been elevated at home per his wife. He is due to have new PCP appointment this month.      Current medicines are reviewed at length with the patient today.    Labs/ tests ordered today include:  Orders Placed This Encounter  Procedures  . Hepatic function panel  . Lipid Profile  . Basic Metabolic Panel (BMET)  . AMB referral to cardiac rehabilitation     Disposition:   FU with   Signed, Joni Reining, NP   01/10/2017 2:32 PM    [facility] [address] [place], [place] [address] Phone: [phone]; Fax: [phone]

## 2017-01-17 ENCOUNTER — Other Ambulatory Visit: Payer: Self-pay | Admitting: Cardiothoracic Surgery

## 2017-01-17 DIAGNOSIS — Z951 Presence of aortocoronary bypass graft: Secondary | ICD-10-CM

## 2017-01-24 ENCOUNTER — Ambulatory Visit (INDEPENDENT_AMBULATORY_CARE_PROVIDER_SITE_OTHER): Payer: Self-pay | Admitting: Physician Assistant

## 2017-01-24 ENCOUNTER — Ambulatory Visit
Admission: RE | Admit: 2017-01-24 | Discharge: 2017-01-24 | Disposition: A | Discharge status: Home / Self Care | Payer: BLUE CROSS/BLUE SHIELD | Source: Ambulatory Visit | Attending: Cardiothoracic Surgery | Admitting: Cardiothoracic Surgery

## 2017-01-24 ENCOUNTER — Other Ambulatory Visit: Payer: Self-pay | Admitting: Physician Assistant

## 2017-01-24 VITALS — BP 106/64 | HR 64 | Resp 16 | Ht 72.0 in | Wt 236.0 lb

## 2017-01-24 DIAGNOSIS — Z951 Presence of aortocoronary bypass graft: Secondary | ICD-10-CM

## 2017-01-24 DIAGNOSIS — I2510 Atherosclerotic heart disease of native coronary artery without angina pectoris: Secondary | ICD-10-CM

## 2017-01-24 NOTE — Patient Instructions (Signed)
Do not resume smoking cigarettes or any other tobacco products.   You are encouraged to enroll and participate in the outpatient cardiac rehab program beginning as soon as practical.  You may return to driving an automobile as long as you are no longer requiring oral narcotic pain relievers during the daytime.  It would be wise to start driving only short distances during the daylight and gradually increase from there as you feel comfortable.   Make every effort to keep your diabetes under very tight control.  Follow up closely with your primary care physician or endocrinologist and strive to keep their hemoglobin A1c levels as low as possible, preferably near or below 6.0.  The long term benefits of strict control of diabetes are far reaching and critically important for your overall health and survival.   Make every effort to maintain a "heart-healthy" lifestyle with regular physical exercise and adherence to a low-fat, low-carbohydrate diet.  Continue to seek regular follow-up appointments with your primary care physician and/or cardiologist.   You may continue to gradually increase your physical activity as tolerated.  Refrain from any heavy lifting or strenuous use of your arms and shoulders until at least 8 weeks from the time of your surgery, and avoid activities that cause increased pain in your chest on the side of your surgical incision.  Otherwise you may continue to increase activities without any particular limitations.  Increase the intensity and duration of physical activity gradually.

## 2017-01-24 NOTE — Progress Notes (Signed)
  HPI:  Patient returns for routine postoperative follow-up having undergone Emergent CABG x 4 on 12/22/2016. The patient's early postoperative recovery while in the hospital followed a routine course.  Since hospital discharge the patient reports he is doing well for the most part.  He continues to have difficulty sleeping.  He has tried some over the counter agents.  He also states he was given a prescription for Vistaril for sleep which also did not provide relief.  He otherwise has no complaints.  He is ambulating independently.  He wants to start driving and return to work.    Current Outpatient Prescriptions  Medication Sig Dispense Refill  . aspirin 325 MG EC tablet Take 1 tablet (325 mg total) by mouth daily. 30 tablet 0  . atorvastatin (LIPITOR) 80 MG tablet Take 1 tablet (80 mg total) by mouth daily at 6 PM. 30 tablet 1  . clopidogrel (PLAVIX) 75 MG tablet Take 1 tablet (75 mg total) by mouth daily. 30 tablet 1  . DiphenhydrAMINE HCl, Sleep, (SLEEP AID) 50 MG CAPS Take 50 mg by mouth at bedtime.    Marland Kitchen doxylamine, Sleep, (UNISOM) 25 MG tablet Take 50 mg by mouth at bedtime as needed.    . glyBURIDE (DIABETA) 5 MG tablet Take 5 mg by mouth 2 (two) times daily.  6  . JARDIANCE 25 MG TABS tablet Take 25 mg by mouth daily.  5  . lisinopril (PRINIVIL,ZESTRIL) 20 MG tablet Take 1 tablet (20 mg total) by mouth daily. 30 tablet 1  . metoprolol tartrate (LOPRESSOR) 25 MG tablet Take 1 tablet (25 mg total) by mouth 2 (two) times daily. 30 tablet 1  . Multiple Vitamin (MULTIVITAMIN WITH MINERALS) TABS tablet Take 1 tablet by mouth daily.    Marland Kitchen omega-3 acid ethyl esters (LOVAZA) 1 g capsule Take 1 capsule (1 g total) by mouth 2 (two) times daily. 30 capsule 1  . oxyCODONE (OXY IR/ROXICODONE) 5 MG immediate release tablet Take 1 tablet (5 mg total) by mouth every 6 (six) hours as needed for severe pain. 30 tablet 0  . ranitidine (ZANTAC) 75 MG tablet Take 150 mg by mouth every evening.     No current  facility-administered medications for this visit.     Physical Exam:  BP 106/64 (BP Location: Right Arm, Patient Position: Sitting, Cuff Size: Large)   Pulse 64   Resp 16   Ht 6' (1.829 m)   Wt 236 lb (107 kg)   SpO2 96% Comment: ON RA  BMI 32.01 kg/m   Gen: no apparent distress Heart: RRR Lungs: CTA bilaterally Abd: soft non-tender, non-distended Ext: mild edema Incisions: healing well  Diagnostic Tests:  CXR: Cardiomegaly, no pleural effusion, pneumothorax, sternal wires are intact  A/P:  1. S/P CABG x 4 doing very well.. Continue medications per Cardiology recommendations, okay to start cardiac rehab 2. Insomnia- stop Vistaril, suggested trying Melatonin vs. Benadryl prn for sleep 3. Mild extremity edema... Could be attributed to heat and being up and about.. Patient educated that should this continue and his notice an elevation in his weight he would need to contact Cardiology or our office for diuresis 4. Dispo- patient doing well.... May return to work without restrictions, may resume driving as he is not taking narcotic pain medication, activity as tolerated, instructed to keep weight to 10-15 lb limit   RTC prn  Lowella Dandy, PA-C [facility] [phone]

## 2017-01-27 ENCOUNTER — Other Ambulatory Visit: Payer: Self-pay | Admitting: Physician Assistant

## 2017-01-28 ENCOUNTER — Other Ambulatory Visit: Payer: Self-pay | Admitting: Adult Health

## 2017-01-28 MED ORDER — OMEGA-3-ACID ETHYL ESTERS 1 G PO CAPS: 1.0000 g | ORAL_CAPSULE | Freq: Two times a day (BID) | ORAL | 1 refills | Status: DC

## 2017-01-28 MED ORDER — METOPROLOL TARTRATE 25 MG PO TABS: 25.0000 mg | ORAL_TABLET | Freq: Two times a day (BID) | ORAL | 1 refills | Status: DC

## 2017-01-28 MED ORDER — ASPIRIN 325 MG PO TBEC: 325.0000 mg | DELAYED_RELEASE_TABLET | Freq: Every day | ORAL | 1 refills | Status: DC

## 2017-01-28 NOTE — Telephone Encounter (Signed)
Needs refills on Omega 3 ethyl esters 1gm cap BID, Metoprololol Tartrate 25 mg BID and ASA 325 mg [facility] [phone] near [employer] / [place]

## 2017-01-28 NOTE — Telephone Encounter (Signed)
Refilled rx as requested

## 2017-01-31 ENCOUNTER — Other Ambulatory Visit: Payer: Self-pay | Admitting: Adult Health

## 2017-02-08 ENCOUNTER — Telehealth: Payer: Self-pay

## 2017-02-08 MED ORDER — ATORVASTATIN CALCIUM 40 MG PO TABS: 40.0000 mg | ORAL_TABLET | Freq: Every day | ORAL | 3 refills | Status: DC

## 2017-02-08 NOTE — Telephone Encounter (Signed)
Patient 6 weeks post CABG and wife reports since then he awakens at night with confusion,finds him wandering about.She states he takes melatonin 10 mg for sleep but is concerned that the atorvastatin 80 mg may be the cause as she spoke with pharmacist.Patient has no mental confusion during the day. Please advise.   Was seen by Harriet Pho and has apt with Dr Purvis Sheffield in August

## 2017-02-08 NOTE — Telephone Encounter (Signed)
Uncertain that this is the cause. It is an important medication for him. Can try reducing to 40 mg to see if this helps.

## 2017-02-21 ENCOUNTER — Other Ambulatory Visit: Payer: Self-pay | Admitting: Physician Assistant

## 2017-02-27 ENCOUNTER — Other Ambulatory Visit: Payer: Self-pay | Admitting: Physician Assistant

## 2017-02-28 ENCOUNTER — Telehealth: Payer: Self-pay | Admitting: Adult Health

## 2017-02-28 ENCOUNTER — Other Ambulatory Visit: Payer: Self-pay

## 2017-02-28 MED ORDER — LISINOPRIL 20 MG PO TABS: 20.0000 mg | ORAL_TABLET | Freq: Every day | ORAL | 11 refills | Status: DC

## 2017-02-28 MED ORDER — CLOPIDOGREL BISULFATE 75 MG PO TABS: 75.0000 mg | ORAL_TABLET | Freq: Every day | ORAL | 11 refills | Status: DC

## 2017-02-28 NOTE — Telephone Encounter (Signed)
Patients needs refills on Clopidigrel and Lisinopril sent to [facility] @ [phone] [place]

## 2017-02-28 NOTE — Telephone Encounter (Signed)
Refill sent in to pharmacy 

## 2017-03-28 ENCOUNTER — Other Ambulatory Visit: Payer: Self-pay | Admitting: Cardiovascular Disease

## 2017-03-28 MED ORDER — ATORVASTATIN CALCIUM 40 MG PO TABS
40.0000 mg | ORAL_TABLET | Freq: Every day | ORAL | 3 refills | Status: DC
Start: 2017-03-28 — End: 2018-03-30

## 2017-03-28 NOTE — Telephone Encounter (Signed)
Refilled lipitor 40 mg per request

## 2017-03-28 NOTE — Telephone Encounter (Signed)
° ° °  1. Which medications need to be refilled? (please list name of each medication and dose if known)  atorvastatin (LIPITOR) 40 MG tablet [[id number]]   2. Which pharmacy/location (including street and city if local pharmacy) is medication to be sent to? [facility] [place] [phone]  3. Do they need a 30 day or 90 day supply? 90 day  Needing a new Rx sent in for the 40mg

## 2017-04-22 ENCOUNTER — Encounter: Payer: Self-pay | Admitting: Cardiovascular Disease

## 2017-04-22 ENCOUNTER — Ambulatory Visit (INDEPENDENT_AMBULATORY_CARE_PROVIDER_SITE_OTHER): Payer: BLUE CROSS/BLUE SHIELD | Admitting: Cardiovascular Disease

## 2017-04-22 VITALS — BP 156/80 | HR 72 | Ht 71.0 in | Wt 240.4 lb

## 2017-04-22 DIAGNOSIS — Z794 Long term (current) use of insulin: Secondary | ICD-10-CM | POA: Diagnosis not present

## 2017-04-22 DIAGNOSIS — Z951 Presence of aortocoronary bypass graft: Secondary | ICD-10-CM

## 2017-04-22 DIAGNOSIS — E118 Type 2 diabetes mellitus with unspecified complications: Secondary | ICD-10-CM | POA: Diagnosis not present

## 2017-04-22 DIAGNOSIS — I255 Ischemic cardiomyopathy: Secondary | ICD-10-CM | POA: Diagnosis not present

## 2017-04-22 DIAGNOSIS — I10 Essential (primary) hypertension: Secondary | ICD-10-CM | POA: Diagnosis not present

## 2017-04-22 DIAGNOSIS — E1165 Type 2 diabetes mellitus with hyperglycemia: Secondary | ICD-10-CM

## 2017-04-22 DIAGNOSIS — Z716 Tobacco abuse counseling: Secondary | ICD-10-CM | POA: Diagnosis not present

## 2017-04-22 DIAGNOSIS — IMO0002 Reserved for concepts with insufficient information to code with codable children: Secondary | ICD-10-CM

## 2017-04-22 DIAGNOSIS — E785 Hyperlipidemia, unspecified: Secondary | ICD-10-CM

## 2017-04-22 DIAGNOSIS — I25708 Atherosclerosis of coronary artery bypass graft(s), unspecified, with other forms of angina pectoris: Secondary | ICD-10-CM

## 2017-04-22 MED ORDER — ASPIRIN EC 81 MG PO TBEC: 81.0000 mg | DELAYED_RELEASE_TABLET | Freq: Every day | ORAL | 3 refills | Status: AC

## 2017-04-22 MED ORDER — LISINOPRIL 20 MG PO TABS: 20.0000 mg | ORAL_TABLET | Freq: Every day | ORAL | 3 refills | Status: DC

## 2017-04-22 NOTE — Patient Instructions (Signed)
Medication Instructions:  Decrease aspirin to 81 mg daily   Labwork: none  Testing/Procedures: none  Follow-Up: Your physician recommends that you schedule a follow-up appointment in: 4 months    Any Other Special Instructions Will Be Listed Below (If Applicable).  I have refilled lisinopril   You have been referred to Dr. Fransico Him for uncontrolled diabetes, someone will contact you to schedule that appointment.     If you need a refill on your cardiac medications before your next appointment, please call your pharmacy.

## 2017-04-22 NOTE — Progress Notes (Signed)
SUBJECTIVE: The patient presents for routine follow-up. He underwent 4 vessel CABG on 12/22/16. He is here with his wife who I am meeting for the first time. He has no recollection of meeting me when he was hospitalized and I transferred him for coronary angiography.  He is doing well and denies exertional chest pain and dyspnea. He denies leg swelling. He has been walking on the treadmill 3-4 miles daily and walks 1-1/2-2 miles daily at work. His blood pressure is elevated, 156/80. He has had a difficult time getting his blood sugars under control. He is on metformin and insulin. He continues to smoke.  TEE performed in the perioperative period demonstrated moderately reduced left ventricular systolic function, LVEF 35-40%.   Review of Systems: As per "subjective", otherwise negative.  Allergies  Allergen Reactions  . Nicotine Rash    Nicotine patch only.    Current Outpatient Prescriptions  Medication Sig Dispense Refill  . aspirin 325 MG EC tablet Take 1 tablet (325 mg total) by mouth daily. 90 tablet 1  . atorvastatin (LIPITOR) 40 MG tablet Take 1 tablet (40 mg total) by mouth daily. 90 tablet 3  . clopidogrel (PLAVIX) 75 MG tablet Take 1 tablet (75 mg total) by mouth daily. 30 tablet 11  . DiphenhydrAMINE HCl, Sleep, (SLEEP AID) 50 MG CAPS Take 50 mg by mouth at bedtime.    . insulin glargine (LANTUS) 100 UNIT/ML injection Inject 54 Units into the skin at bedtime.     . Melatonin 10 MG TABS Take 1 tablet by mouth at bedtime.    . metFORMIN (GLUCOPHAGE) 500 MG tablet Take 1 tablet by mouth daily.  2  . metoprolol tartrate (LOPRESSOR) 25 MG tablet Take 1 tablet (25 mg total) by mouth 2 (two) times daily. 180 tablet 1  . Multiple Vitamin (MULTIVITAMIN WITH MINERALS) TABS tablet Take 1 tablet by mouth daily.    Marland Kitchen omega-3 acid ethyl esters (LOVAZA) 1 g capsule Take 1 capsule (1 g total) by mouth 2 (two) times daily. 90 capsule 1  . lisinopril (PRINIVIL,ZESTRIL) 20 MG tablet Take  1 tablet by mouth daily.  0   No current facility-administered medications for this visit.     Past Medical History:  Diagnosis Date  . Diabetes mellitus without complication (HCC)   . Hyperlipidemia   . Obesity   . Tobacco abuse     Past Surgical History:  Procedure Laterality Date  . ABDOMINAL AORTOGRAM N/A 12/22/2016   Procedure: Abdominal Aortogram;  Surgeon: Lennette Bihari, MD;  Location: [facility] INVASIVE CV LAB;  Service: Cardiovascular;  Laterality: N/A;  . COLON RESECTION     due to bowel rupture  . CORONARY ARTERY BYPASS GRAFT N/A 12/22/2016   Procedure: CORONARY ARTERY BYPASS GRAFTING (CABG) x four using left internal mammary artery and right greater saphenous leg vein using endoscope.  Lima-LAD, SVG-PD, SVG-OM, SVG-Diag.;  Surgeon: Delight Ovens, MD;  Location: [facility] OR;  Service: Open Heart Surgery;  Laterality: N/A;  . IABP INSERTION N/A 12/22/2016   Procedure: IABP Insertion;  Surgeon: Lennette Bihari, MD;  Location: [facility] INVASIVE CV LAB;  Service: Cardiovascular;  Laterality: N/A;  . LEFT HEART CATH AND CORONARY ANGIOGRAPHY N/A 12/22/2016   Procedure: Left Heart Cath and Coronary Angiography;  Surgeon: Lennette Bihari, MD;  Location: [facility] INVASIVE CV LAB;  Service: Cardiovascular;  Laterality: N/A;  . TEE WITHOUT CARDIOVERSION N/A 12/22/2016   Procedure: TRANSESOPHAGEAL ECHOCARDIOGRAM (TEE);  Surgeon: Delight Ovens, MD;  Location: [facility] OR;  Service: Open Heart Surgery;  Laterality: N/A;    Social History   Social History  . Marital status: Married    Spouse name: N/A  . Number of children: N/A  . Years of education: N/A   Occupational History  . Not on file.   Social History Main Topics  . Smoking status: Current Every Day Smoker    Packs/day: 1.00    Types: Cigarettes    Start date: 07/16/1982  . Smokeless tobacco: Never Used     Comment: >30 years  . Alcohol use Yes     Comment: Very rarely  . Drug use: No  . Sexual activity: Not on file   Other Topics Concern    . Not on file   Social History Narrative  . No narrative on file     Vitals:   04/22/17 0913  BP: (!) 156/80  Pulse: 72  SpO2: 94%  Weight: 240 lb 6.4 oz (109 kg)  Height: 5\' 11"  (1.803 m)    Wt Readings from Last 3 Encounters:  04/22/17 240 lb 6.4 oz (109 kg)  01/24/17 236 lb (107 kg)  01/10/17 224 lb (101.6 kg)     PHYSICAL EXAM General: NAD HEENT: Normal. Neck: No JVD, no thyromegaly. Lungs: Clear to auscultation bilaterally with normal respiratory effort. CV: Nondisplaced PMI.  Regular rate and rhythm, normal S1/S2, no S3/S4, no murmur. No pretibial or periankle edema.  No carotid bruit.   Abdomen: Protuberant. Neurologic: Alert and oriented.  Psych: Normal affect. Skin: Normal. Musculoskeletal: No gross deformities.    ECG: Most recent ECG reviewed.   Labs: Lab Results  Component Value Date/Time   K 4.5 12/27/2016 09:43 AM   BUN 35 (H) 12/27/2016 09:43 AM   CREATININE 1.00 12/27/2016 09:43 AM   ALT 35 12/23/2016 03:59 AM   HGB 10.8 (L) 12/29/2016 02:14 AM     Lipids: Lab Results  Component Value Date/Time   LDLCALC UNABLE TO CALCULATE IF TRIGLYCERIDE OVER 400 mg/dL 16/06/9603 54:09 AM   CHOL 202 (H) 12/21/2016 05:26 AM   TRIG 444 (H) 12/21/2016 05:26 AM   HDL 37 (L) 12/21/2016 05:26 AM       ASSESSMENT AND PLAN: 1. Coronary artery disease status post 4 vessel CABG: Symptomatically stable. I will reduce aspirin to 81 mg daily. Given history of MI, I will continue Plavix for a minimum of 1 year. Continue Lipitor 40 mg. He is on lisinopril which I will refill and he is on metoprolol 25 mg twice daily. I will reassess cardiac function in several months to assess for interval improvement in LVEF.  2. Hypertension: Elevated. I will refill lisinopril 20 mg daily.  3. Hyperlipidemia: Continue Lipitor 40 g.  4. Tobacco abuse: Cessation counseling provided (3 minutes).  5. Poorly controlled type 2 diabetes mellitus: He is currently on metformin 500 mg  daily and insulin. He needs a goal HbA1c close to 7%. I will make a referral to endocrinology (Dr. Fransico Him) as per patient's request.      Disposition: Follow up 4 months.  Time spent: 40 minutes, of which greater than 50% was spent reviewing symptoms, relevant blood tests and studies, and discussing management plan with the patient.    Prentice Docker, M.D., F.A.C.C.

## 2017-04-23 ENCOUNTER — Other Ambulatory Visit: Payer: Self-pay | Admitting: Adult Health

## 2017-05-05 NOTE — Addendum Note (Signed)
Addendum  created 05/05/17 1256 by Kipp Brood, MD   Sign clinical note

## 2017-05-31 ENCOUNTER — Encounter: Payer: Self-pay | Admitting: "Endocrinology

## 2017-05-31 ENCOUNTER — Ambulatory Visit (INDEPENDENT_AMBULATORY_CARE_PROVIDER_SITE_OTHER): Payer: BLUE CROSS/BLUE SHIELD | Admitting: "Endocrinology

## 2017-05-31 VITALS — BP 141/80 | HR 71 | Ht 71.0 in | Wt 242.0 lb

## 2017-05-31 DIAGNOSIS — E782 Mixed hyperlipidemia: Secondary | ICD-10-CM

## 2017-05-31 DIAGNOSIS — E1159 Type 2 diabetes mellitus with other circulatory complications: Secondary | ICD-10-CM | POA: Diagnosis not present

## 2017-05-31 DIAGNOSIS — E6609 Other obesity due to excess calories: Secondary | ICD-10-CM | POA: Insufficient documentation

## 2017-05-31 DIAGNOSIS — I10 Essential (primary) hypertension: Secondary | ICD-10-CM

## 2017-05-31 DIAGNOSIS — Z6833 Body mass index (BMI) 33.0-33.9, adult: Secondary | ICD-10-CM

## 2017-05-31 MED ORDER — INSULIN GLARGINE 300 UNIT/ML ~~LOC~~ SOPN: 60.0000 [IU] | PEN_INJECTOR | Freq: Every day | SUBCUTANEOUS | 2 refills | Status: DC

## 2017-05-31 MED ORDER — METFORMIN HCL 1000 MG PO TABS: 1000.0000 mg | ORAL_TABLET | Freq: Two times a day (BID) | ORAL | 2 refills | Status: DC

## 2017-05-31 NOTE — Patient Instructions (Signed)

## 2017-05-31 NOTE — Progress Notes (Signed)
Subjective:    Patient ID: Brad Olson, male    DOB: Apr 07, 1961.  he is being seen in consultation for management of currently uncontrolled symptomatic diabetes requested by  System, Provider Not In.   Past Medical History:  Diagnosis Date  . Diabetes mellitus without complication (HCC)   . Hyperlipidemia   . Obesity   . Tobacco abuse    Past Surgical History:  Procedure Laterality Date  . ABDOMINAL AORTOGRAM N/A 12/22/2016   Procedure: Abdominal Aortogram;  Surgeon: Lennette Bihari, MD;  Location: [facility] INVASIVE CV LAB;  Service: Cardiovascular;  Laterality: N/A;  . COLON RESECTION     due to bowel rupture  . CORONARY ARTERY BYPASS GRAFT N/A 12/22/2016   Procedure: CORONARY ARTERY BYPASS GRAFTING (CABG) x four using left internal mammary artery and right greater saphenous leg vein using endoscope.  Lima-LAD, SVG-PD, SVG-OM, SVG-Diag.;  Surgeon: Delight Ovens, MD;  Location: [facility] OR;  Service: Open Heart Surgery;  Laterality: N/A;  . IABP INSERTION N/A 12/22/2016   Procedure: IABP Insertion;  Surgeon: Lennette Bihari, MD;  Location: [facility] INVASIVE CV LAB;  Service: Cardiovascular;  Laterality: N/A;  . LEFT HEART CATH AND CORONARY ANGIOGRAPHY N/A 12/22/2016   Procedure: Left Heart Cath and Coronary Angiography;  Surgeon: Lennette Bihari, MD;  Location: [facility] INVASIVE CV LAB;  Service: Cardiovascular;  Laterality: N/A;  . TEE WITHOUT CARDIOVERSION N/A 12/22/2016   Procedure: TRANSESOPHAGEAL ECHOCARDIOGRAM (TEE);  Surgeon: Delight Ovens, MD;  Location: [facility] OR;  Service: Open Heart Surgery;  Laterality: N/A;   Social History   Social History  . Marital status: Married    Spouse name: N/A  . Number of children: N/A  . Years of education: N/A   Social History Main Topics  . Smoking status: Current Every Day Smoker    Packs/day: 1.00    Years: 30.00    Types: Cigarettes    Start date: 07/16/1982  . Smokeless tobacco: Never Used     Comment: >30 years  . Alcohol use Yes     Comment:  Very rarely  . Drug use: No  . Sexual activity: Not Asked   Other Topics Concern  . None   Social History Narrative  . None   Outpatient Encounter Prescriptions as of 05/31/2017  Medication Sig  . aspirin EC 81 MG tablet Take 1 tablet (81 mg total) by mouth daily.  Marland Kitchen atorvastatin (LIPITOR) 40 MG tablet Take 1 tablet (40 mg total) by mouth daily.  . clopidogrel (PLAVIX) 75 MG tablet Take 1 tablet (75 mg total) by mouth daily.  . DiphenhydrAMINE HCl, Sleep, (SLEEP AID) 50 MG CAPS Take 50 mg by mouth at bedtime.  Marland Kitchen lisinopril (PRINIVIL,ZESTRIL) 20 MG tablet Take 1 tablet (20 mg total) by mouth daily.  . Melatonin 10 MG TABS Take 1 tablet by mouth at bedtime.  . metFORMIN (GLUCOPHAGE) 1000 MG tablet Take 1 tablet (1,000 mg total) by mouth 2 (two) times daily with a meal.  . metoprolol tartrate (LOPRESSOR) 25 MG tablet Take 1 tablet (25 mg total) by mouth 2 (two) times daily.  . Multiple Vitamin (MULTIVITAMIN WITH MINERALS) TABS tablet Take 1 tablet by mouth daily.  Marland Kitchen omega-3 acid ethyl esters (LOVAZA) 1 g capsule TAKE 1 CAPSULE (1 G TOTAL) BY MOUTH 2 (TWO) TIMES DAILY.  . [DISCONTINUED] insulin glargine (LANTUS) 100 UNIT/ML injection Inject 60 Units into the skin at bedtime.   . [DISCONTINUED] metFORMIN (GLUCOPHAGE) 500 MG tablet Take 1 tablet by  mouth daily.  . Insulin Glargine (TOUJEO MAX SOLOSTAR) 300 UNIT/ML SOPN Inject 60 Units into the skin at bedtime.   No facility-administered encounter medications on file as of 05/31/2017.     ALLERGIES: Allergies  Allergen Reactions  . Nicotine Rash    Nicotine patch only.    VACCINATION STATUS:  There is no immunization history on file for this patient.  Diabetes  He presents for his initial diabetic visit. He has type 2 diabetes mellitus. Onset time: He was diagnosed at approximate age of 56 years. His disease course has been worsening. There are no hypoglycemic associated symptoms. Pertinent negatives for hypoglycemia include no  confusion, headaches, pallor or seizures. Associated symptoms include foot paresthesias, polydipsia and polyuria. Pertinent negatives for diabetes include no chest pain, no fatigue, no polyphagia and no weakness. There are no hypoglycemic complications. Symptoms are worsening. Diabetic complications include heart disease and PVD. (He is status post quadruple bypass of coronary artery disease in April 2018.) Risk factors for coronary artery disease include dyslipidemia, diabetes mellitus, family history, male sex, obesity, hypertension, sedentary lifestyle and tobacco exposure. Current diabetic treatment includes insulin injections and oral agent (monotherapy). His weight is increasing steadily. He is following a generally unhealthy diet. When asked about meal planning, he reported none. He has not had a previous visit with a dietitian. He never participates in exercise. His overall blood glucose range is 180-200 mg/dl. An ACE inhibitor/angiotensin II receptor blocker is being taken. He sees a podiatrist. Hyperlipidemia  This is a chronic problem. The current episode started more than 1 year ago. The problem is uncontrolled (He has severe hypertriglyceridemia over 444.). Recent lipid tests were reviewed and are high. Exacerbating diseases include diabetes and obesity. Factors aggravating his hyperlipidemia include smoking. Pertinent negatives include no chest pain, myalgias or shortness of breath. Current antihyperlipidemic treatment includes statins and bile acid squestrants. Risk factors for coronary artery disease include diabetes mellitus, dyslipidemia, family history, male sex, obesity, a sedentary lifestyle and hypertension.  Hypertension  This is a chronic problem. The current episode started more than 1 year ago. The problem is uncontrolled. Pertinent negatives include no chest pain, headaches, neck pain, palpitations or shortness of breath. Past treatments include ACE inhibitors and beta blockers.  Hypertensive end-organ damage includes CAD/MI and PVD.       Review of Systems  Constitutional: Negative for chills, fatigue, fever and unexpected weight change.  HENT: Negative for dental problem, mouth sores and trouble swallowing.   Eyes: Negative for visual disturbance.  Respiratory: Negative for cough, choking, chest tightness, shortness of breath and wheezing.   Cardiovascular: Negative for chest pain, palpitations and leg swelling.  Gastrointestinal: Negative for abdominal distention, abdominal pain, constipation, diarrhea, nausea and vomiting.  Endocrine: Positive for polydipsia and polyuria. Negative for polyphagia.  Genitourinary: Negative for dysuria, flank pain, hematuria and urgency.  Musculoskeletal: Negative for back pain, gait problem, myalgias and neck pain.  Skin: Negative for pallor, rash and wound.  Neurological: Negative for seizures, syncope, weakness, numbness and headaches.  Psychiatric/Behavioral: Negative.  Negative for confusion and dysphoric mood.    Objective:    BP (!) 141/80   Pulse 71   Ht  (1.803 m)   Wt 242 lb (109.8 kg)   BMI 33.75 kg/m   Wt Readings from Last 3 Encounters:  05/31/17 242 lb (109.8 kg)  04/22/17 240 lb 6.4 oz (109 kg)  01/24/17 236 lb (107 kg)     Physical Exam  Constitutional: He is oriented to  person, place, and time. He appears well-developed and well-nourished. He is cooperative. No distress.  HENT:  Head: Normocephalic and atraumatic.  Eyes: EOM are normal.  Neck: Normal range of motion. Neck supple. No tracheal deviation present. No thyromegaly present.  Cardiovascular: Normal rate, S1 normal, S2 normal and normal heart sounds.  Exam reveals no gallop.   No murmur heard. Pulses:      Dorsalis pedis pulses are 1+ on the right side, and 1+ on the left side.       Posterior tibial pulses are 1+ on the right side, and 1+ on the left side.  Pulmonary/Chest: Breath sounds normal. No respiratory distress. He has no  wheezes.  Abdominal: Soft. Bowel sounds are normal. He exhibits no distension. There is no tenderness. There is no guarding and no CVA tenderness.  Musculoskeletal: He exhibits no edema.       Right shoulder: He exhibits no swelling and no deformity.  Neurological: He is alert and oriented to person, place, and time. He has normal strength and normal reflexes. No cranial nerve deficit or sensory deficit. Gait normal.  Skin: Skin is warm and dry. No rash noted. No cyanosis. Nails show no clubbing.  Psychiatric: His speech is normal. Cognition and memory are normal.  Reluctant affect.    CMP ( most recent) CMP     Component Value Date/Time   NA 136 12/27/2016 0943   K 4.5 12/27/2016 0943   CL 101 12/27/2016 0943   CO2 25 12/27/2016 0943   GLUCOSE 233 (H) 12/27/2016 0943   BUN 35 (H) 12/27/2016 0943   CREATININE 1.00 12/27/2016 0943   CALCIUM 8.6 (L) 12/27/2016 0943   PROT 5.2 (L) 12/23/2016 0359   ALBUMIN 3.4 (L) 12/23/2016 0359   AST 60 (H) 12/23/2016 0359   ALT 35 12/23/2016 0359   ALKPHOS 26 (L) 12/23/2016 0359   BILITOT 1.2 12/23/2016 0359   GFRNONAA >60 12/27/2016 0943   GFRAA >60 12/27/2016 0943     Diabetic Labs (most recent): Lab Results  Component Value Date   HGBA1C 8.5 (H) 12/21/2016   HGBA1C 8.5 (H) 12/20/2016     Lipid Panel ( most recent) Lipid Panel     Component Value Date/Time   CHOL 202 (H) 12/21/2016 0526   TRIG 444 (H) 12/21/2016 0526   HDL 37 (L) 12/21/2016 0526   CHOLHDL 5.5 12/21/2016 0526   VLDL UNABLE TO CALCULATE IF TRIGLYCERIDE OVER 400 mg/dL 78/29/5621 3086   LDLCALC UNABLE TO CALCULATE IF TRIGLYCERIDE OVER 400 mg/dL 57/84/6962 9528      Assessment & Plan:   1. DM type 2 causing vascular disease (HCC)  - Patient has currently uncontrolled symptomatic type 2 DM since  56 years of age,  with most recent A1c of 8.5 %. Recent labs reviewed.  -his diabetes is complicated by coronary artery disease status post quadruple bypass graft in  April 2018, severe peripheral arterial disease, obesity/sedentary life and [place] remains at a high risk for more acute and chronic complications which include CAD, CVA, CKD, retinopathy, and neuropathy. These are all discussed in detail with the patient.  - I have counseled him on diet management and weight loss, by adopting a carbohydrate restricted/protein rich diet.  - Suggestion is made for him to avoid simple carbohydrates  from his diet including Cakes, Sweet Desserts, Ice Cream, Soda (diet and regular), Sweet Tea, Candies, Chips, Cookies, Store Bought Juices, Alcohol in Excess of  1-2 drinks a day, Artificial Sweeteners, and "Sugar-free"  Products. This will help patient to have stable blood glucose profile and potentially avoid unintended weight gain.  - I encouraged him to switch to  unprocessed or minimally processed complex starch and increased protein intake (animal or plant source), fruits, and vegetables.  - he is advised to stick to a routine mealtimes to eat 3 meals  a day and avoid unnecessary snacks ( to snack only to correct hypoglycemia).   - he will be scheduled with Norm Salt, RDN, CDE for individualized DM education.  - I have approached him with the following individualized plan to manage diabetes and patient agrees:   - I  will proceed to readjust his basal insulin , and switch Lantus to  Toujeo 60 units daily at bedtime  , and initiate  strict monitoring of glucose 4 times a day-before meals and at bedtime, and return in one week with his meter and logs. He may be considered for prandial insulin if he continues to have significantly above target blood glucose readings. - Patient is warned not to take insulin without proper monitoring per orders. -Adjustment parameters are given for hypo and hyperglycemia in writing. -Patient is encouraged to call clinic for blood glucose levels less than 70 or above 300 mg /dl. - I will continue  metformin 1000 g by mouth  twice a day, therapeutically suitable for patient. -  He is not suitable candidate for incretin therapy  due to severe hypertriglyceridemia. I have advised him to avoid fried food and butter. - Patient specific target  A1c;  LDL, HDL, Triglycerides, and  Waist Circumference were discussed in detail.  2) BP/HTN:  Uncontrolled. Continue current medications including ACEI/ARB. 3) Lipids/HPL:   Uncontrolled, triglycerides 444.   Patient is advised to continue statins and Lovaza. 4)  Weight/Diet: CDE Consult will be initiated , exercise, and detailed carbohydrates information provided.  5) Chronic Care/Health Maintenance:  -he  is on ACEI/ARB and Statin medications and  is encouraged to continue to follow up with Ophthalmology, Dentist,  Podiatrist at least yearly or according to recommendations, and advised to  quit smoking. I have recommended yearly flu vaccine and pneumonia vaccination at least every 5 years; moderate intensity exercise for up to 150 minutes weekly; and  sleep for at least 7 hours a day.  - Time spent with the patient: 1 hour, of which >50% was spent in obtaining information about his symptoms, reviewing his previous labs, evaluations, and treatments, counseling him about his  complicated and currently uncontrolled type 2 diabetes, hyperlipidemia, hypertension and developing a plan for long term treatment; he had a number of questions which I addressed.  - Patient to bring meter and  blood glucose logs during his next visit.  - I advised patient to maintain close follow up with System, Provider Not In for primary care needs.  Follow up plan: - Return in about 1 week (around 06/07/2017) for follow up with meter and logs- no labs.  Marquis Lunch, MD Phone: [phone]  Fax: [phone]   05/31/2017, 12:14 PM This note was partially dictated with voice recognition software. Similar sounding words can be transcribed inadequately or may not  be corrected upon review.

## 2017-06-08 ENCOUNTER — Ambulatory Visit (INDEPENDENT_AMBULATORY_CARE_PROVIDER_SITE_OTHER): Payer: BLUE CROSS/BLUE SHIELD | Admitting: "Endocrinology

## 2017-06-08 ENCOUNTER — Encounter: Payer: Self-pay | Admitting: "Endocrinology

## 2017-06-08 VITALS — BP 129/81 | HR 71 | Ht 71.0 in | Wt 239.0 lb

## 2017-06-08 DIAGNOSIS — I10 Essential (primary) hypertension: Secondary | ICD-10-CM

## 2017-06-08 DIAGNOSIS — E782 Mixed hyperlipidemia: Secondary | ICD-10-CM | POA: Diagnosis not present

## 2017-06-08 DIAGNOSIS — F17200 Nicotine dependence, unspecified, uncomplicated: Secondary | ICD-10-CM

## 2017-06-08 DIAGNOSIS — E1159 Type 2 diabetes mellitus with other circulatory complications: Secondary | ICD-10-CM | POA: Diagnosis not present

## 2017-06-08 MED ORDER — INSULIN GLARGINE 300 UNIT/ML ~~LOC~~ SOPN: 70.0000 [IU] | PEN_INJECTOR | Freq: Every day | SUBCUTANEOUS | 2 refills | Status: DC

## 2017-06-08 NOTE — Patient Instructions (Signed)

## 2017-06-08 NOTE — Progress Notes (Signed)
Subjective:    Patient ID: Brad Olson, male    DOB: [birth year].  he is being seen in consultation for management of currently uncontrolled symptomatic diabetes requested by  System, Provider Not In.   Past Medical History:  Diagnosis Date  . Diabetes mellitus without complication (HCC)   . Hyperlipidemia   . Obesity   . Tobacco abuse    Past Surgical History:  Procedure Laterality Date  . ABDOMINAL AORTOGRAM N/A 12/22/2016   Procedure: Abdominal Aortogram;  Surgeon: Lennette Bihari, MD;  Location: [facility] INVASIVE CV LAB;  Service: Cardiovascular;  Laterality: N/A;  . COLON RESECTION     due to bowel rupture  . CORONARY ARTERY BYPASS GRAFT N/A 12/22/2016   Procedure: CORONARY ARTERY BYPASS GRAFTING (CABG) x four using left internal mammary artery and right greater saphenous leg vein using endoscope.  Lima-LAD, SVG-PD, SVG-OM, SVG-Diag.;  Surgeon: Delight Ovens, MD;  Location: [facility] OR;  Service: Open Heart Surgery;  Laterality: N/A;  . IABP INSERTION N/A 12/22/2016   Procedure: IABP Insertion;  Surgeon: Lennette Bihari, MD;  Location: [facility] INVASIVE CV LAB;  Service: Cardiovascular;  Laterality: N/A;  . LEFT HEART CATH AND CORONARY ANGIOGRAPHY N/A 12/22/2016   Procedure: Left Heart Cath and Coronary Angiography;  Surgeon: Lennette Bihari, MD;  Location: [facility] INVASIVE CV LAB;  Service: Cardiovascular;  Laterality: N/A;  . TEE WITHOUT CARDIOVERSION N/A 12/22/2016   Procedure: TRANSESOPHAGEAL ECHOCARDIOGRAM (TEE);  Surgeon: Delight Ovens, MD;  Location: [facility] OR;  Service: Open Heart Surgery;  Laterality: N/A;   Social History   Social History  . Marital status: Married    Spouse name: N/A  . Number of children: N/A  . Years of education: N/A   Social History Main Topics  . Smoking status: Current Every Day Smoker    Packs/day: 1.00    Years: 30.00    Types: Cigarettes    Start date: 07/16/1982  . Smokeless tobacco: Never Used     Comment: >30 years  . Alcohol use Yes     Comment:  Very rarely  . Drug use: No  . Sexual activity: Not Asked   Other Topics Concern  . None   Social History Narrative  . None   Outpatient Encounter Prescriptions as of 06/08/2017  Medication Sig  . aspirin EC 81 MG tablet Take 1 tablet (81 mg total) by mouth daily.  Marland Kitchen atorvastatin (LIPITOR) 40 MG tablet Take 1 tablet (40 mg total) by mouth daily.  . clopidogrel (PLAVIX) 75 MG tablet Take 1 tablet (75 mg total) by mouth daily.  . DiphenhydrAMINE HCl, Sleep, (SLEEP AID) 50 MG CAPS Take 50 mg by mouth at bedtime.  . Insulin Glargine (TOUJEO MAX SOLOSTAR) 300 UNIT/ML SOPN Inject 70 Units into the skin at bedtime.  Marland Kitchen lisinopril (PRINIVIL,ZESTRIL) 20 MG tablet Take 1 tablet (20 mg total) by mouth daily.  . Melatonin 10 MG TABS Take 1 tablet by mouth at bedtime.  . metFORMIN (GLUCOPHAGE) 1000 MG tablet Take 1 tablet (1,000 mg total) by mouth 2 (two) times daily with a meal.  . metoprolol tartrate (LOPRESSOR) 25 MG tablet Take 1 tablet (25 mg total) by mouth 2 (two) times daily.  . Multiple Vitamin (MULTIVITAMIN WITH MINERALS) TABS tablet Take 1 tablet by mouth daily.  Marland Kitchen omega-3 acid ethyl esters (LOVAZA) 1 g capsule TAKE 1 CAPSULE (1 G TOTAL) BY MOUTH 2 (TWO) TIMES DAILY.  . [DISCONTINUED] Insulin Glargine (TOUJEO MAX SOLOSTAR) 300 UNIT/ML SOPN Inject  60 Units into the skin at bedtime.   No facility-administered encounter medications on file as of 06/08/2017.     ALLERGIES: Allergies  Allergen Reactions  . Nicotine Rash    Nicotine patch only.    VACCINATION STATUS:  There is no immunization history on file for this patient.  Diabetes  He presents for his follow-up diabetic visit. He has type 2 diabetes mellitus. Onset time: He was diagnosed at approximate age of 56 years. His disease course has been improving. There are no hypoglycemic associated symptoms. Pertinent negatives for hypoglycemia include no confusion, headaches, pallor or seizures. Associated symptoms include foot  paresthesias, polydipsia and polyuria. Pertinent negatives for diabetes include no chest pain, no fatigue, no polyphagia and no weakness. There are no hypoglycemic complications. Symptoms are improving. Diabetic complications include heart disease and PVD. (He is status post quadruple bypass of coronary artery disease in April 2018.) Risk factors for coronary artery disease include dyslipidemia, diabetes mellitus, family history, male sex, obesity, hypertension, sedentary lifestyle and tobacco exposure. Current diabetic treatment includes insulin injections and oral agent (monotherapy). His weight is decreasing steadily. He is following a generally unhealthy diet. When asked about meal planning, he reported none. He has not had a previous visit with a dietitian. He never participates in exercise. His breakfast blood glucose range is generally 180-200 mg/dl. His lunch blood glucose range is generally 180-200 mg/dl. His dinner blood glucose range is generally 180-200 mg/dl. His overall blood glucose range is 180-200 mg/dl. An ACE inhibitor/angiotensin II receptor blocker is being taken. He sees a podiatrist. Hyperlipidemia  This is a chronic problem. The current episode started more than 1 year ago. The problem is uncontrolled (He has severe hypertriglyceridemia over 444.). Recent lipid tests were reviewed and are high. Exacerbating diseases include diabetes and obesity. Factors aggravating his hyperlipidemia include smoking. Pertinent negatives include no chest pain, myalgias or shortness of breath. Current antihyperlipidemic treatment includes statins and bile acid squestrants. Risk factors for coronary artery disease include diabetes mellitus, dyslipidemia, family history, male sex, obesity, a sedentary lifestyle and hypertension.  Hypertension  This is a chronic problem. The current episode started more than 1 year ago. The problem is uncontrolled. Pertinent negatives include no chest pain, headaches, neck  pain, palpitations or shortness of breath. Past treatments include ACE inhibitors and beta blockers. Hypertensive end-organ damage includes CAD/MI and PVD.     Review of Systems  Constitutional: Negative for chills, fatigue, fever and unexpected weight change.  HENT: Negative for dental problem, mouth sores and trouble swallowing.   Eyes: Negative for visual disturbance.  Respiratory: Negative for cough, choking, chest tightness, shortness of breath and wheezing.   Cardiovascular: Negative for chest pain, palpitations and leg swelling.  Gastrointestinal: Negative for abdominal distention, abdominal pain, constipation, diarrhea, nausea and vomiting.  Endocrine: Positive for polydipsia and polyuria. Negative for polyphagia.  Genitourinary: Negative for dysuria, flank pain, hematuria and urgency.  Musculoskeletal: Negative for back pain, gait problem, myalgias and neck pain.  Skin: Negative for pallor, rash and wound.  Neurological: Negative for seizures, syncope, weakness, numbness and headaches.  Psychiatric/Behavioral: Negative.  Negative for confusion and dysphoric mood.    Objective:    BP 129/81   Pulse 71   Ht  (1.803 m)   Wt 239 lb (108.4 kg)   BMI 33.33 kg/m   Wt Readings from Last 3 Encounters:  06/08/17 239 lb (108.4 kg)  05/31/17 242 lb (109.8 kg)  04/22/17 240 lb 6.4 oz (109 kg)  Physical Exam  Constitutional: He is oriented to person, place, and time. He appears well-developed and well-nourished. He is cooperative. No distress.  HENT:  Head: Normocephalic and atraumatic.  Eyes: EOM are normal.  Neck: Normal range of motion. Neck supple. No tracheal deviation present. No thyromegaly present.  Cardiovascular: Normal rate, S1 normal, S2 normal and normal heart sounds.  Exam reveals no gallop.   No murmur heard. Pulses:      Dorsalis pedis pulses are 1+ on the right side, and 1+ on the left side.       Posterior tibial pulses are 1+ on the right side, and 1+  on the left side.  Pulmonary/Chest: Breath sounds normal. No respiratory distress. He has no wheezes.  Abdominal: Soft. Bowel sounds are normal. He exhibits no distension. There is no tenderness. There is no guarding and no CVA tenderness.  Musculoskeletal: He exhibits no edema.       Right shoulder: He exhibits no swelling and no deformity.  Neurological: He is alert and oriented to person, place, and time. He has normal strength and normal reflexes. No cranial nerve deficit or sensory deficit. Gait normal.  Skin: Skin is warm and dry. No rash noted. No cyanosis. Nails show no clubbing.  Psychiatric: His speech is normal. Cognition and memory are normal.  Reluctant affect.    CMP ( most recent) CMP     Component Value Date/Time   NA 136 12/27/2016 0943   K 4.5 12/27/2016 0943   CL 101 12/27/2016 0943   CO2 25 12/27/2016 0943   GLUCOSE 233 (H) 12/27/2016 0943   BUN 35 (H) 12/27/2016 0943   CREATININE 1.00 12/27/2016 0943   CALCIUM 8.6 (L) 12/27/2016 0943   PROT 5.2 (L) 12/23/2016 0359   ALBUMIN 3.4 (L) 12/23/2016 0359   AST 60 (H) 12/23/2016 0359   ALT 35 12/23/2016 0359   ALKPHOS 26 (L) 12/23/2016 0359   BILITOT 1.2 12/23/2016 0359   GFRNONAA >60 12/27/2016 0943   GFRAA >60 12/27/2016 0943     Diabetic Labs (most recent): Lab Results  Component Value Date   HGBA1C 8.5 (H) 12/21/2016   HGBA1C 8.5 (H) 12/20/2016     Lipid Panel ( most recent) Lipid Panel     Component Value Date/Time   CHOL 202 (H) 12/21/2016 0526   TRIG 444 (H) 12/21/2016 0526   HDL 37 (L) 12/21/2016 0526   CHOLHDL 5.5 12/21/2016 0526   VLDL UNABLE TO CALCULATE IF TRIGLYCERIDE OVER 400 mg/dL 81/19/1478 2956   LDLCALC UNABLE TO CALCULATE IF TRIGLYCERIDE OVER 400 mg/dL 21/30/8657 8469      Assessment & Plan:   1. DM type 2 causing vascular disease (HCC)  - Patient has currently uncontrolled symptomatic type 2 DM since  56 years of age,  with most recent A1c of 8.5 % from April ,  2018.  -his  diabetes is complicated by coronary artery disease status post quadruple bypass graft in April 2018, severe peripheral arterial disease, obesity/sedentary life and Ancelmo Carico remains at a high risk for more acute and chronic complications which include CAD, CVA, CKD, retinopathy, and neuropathy. These are all discussed in detail with the patient.  - I have counseled him on diet management and weight loss, by adopting a carbohydrate restricted/protein rich diet.  - Suggestion is made for him to avoid simple carbohydrates  from his diet including Cakes, Sweet Desserts, Ice Cream, Soda (diet and regular), Sweet Tea, Candies, Chips, Cookies, Store Bought Juices, Alcohol in Excess  of  1-2 drinks a day, Artificial Sweeteners, and "Sugar-free" Products. This will help patient to have stable blood glucose profile and potentially avoid unintended weight gain.  - I encouraged him to switch to  unprocessed or minimally processed complex starch and increased protein intake (animal or plant source), fruits, and vegetables.  - he is advised to stick to a routine mealtimes to eat 3 meals  a day and avoid unnecessary snacks ( to snack only to correct hypoglycemia).   - I have approached him with the following individualized plan to manage diabetes and patient agrees:   - He has responded to adjusted basal insulin, average blood glucose 187. -  I  will proceed to readjust his basal insulin , and increase  Toujeo to 70 units daily at bedtime  , and  continue strict monitoring of glucose 2 times a day-before breakfast and at bedtime. - Based on his blood glucose profile, he will not require bolus insulin at this time.  -Patient is encouraged to call clinic for blood glucose levels less than 70 or above 300 mg /dl. - I will continue  metformin 1000 mg by mouth twice a day, therapeutically suitable for patient. -  He is not suitable candidate for incretin therapy  due to severe hypertriglyceridemia. I have  advised him to avoid fried food and butter. - Patient specific target  A1c;  LDL, HDL, Triglycerides, and  Waist Circumference were discussed in detail.  2) BP/HTN:  controlled. Continue current medications including ACEI/ARB. 3) Lipids/HPL:   Uncontrolled, triglycerides 444.   Patient is advised to continue statins and Lovaza. 4)  Weight/Diet: CDE Consult has been initiated , exercise, and detailed carbohydrates information provided.  5) Chronic Care/Health Maintenance:  -he  is on ACEI/ARB and Statin medications and  is encouraged to continue to follow up with Ophthalmology, Dentist,  Podiatrist at least yearly or according to recommendations, and advised to  quit smoking. I have recommended yearly flu vaccine and pneumonia vaccination at least every 5 years; moderate intensity exercise for up to 150 minutes weekly; and  sleep for at least 7 hours a day.  - Time spent with the patient: 25 min, of which >50% was spent in reviewing his sugar logs , discussing his hypo- and hyper-glycemic episodes, reviewing his current and  previous labs and insulin doses and developing a plan to avoid hypo- and hyper-glycemia.    - Patient to bring meter and  blood glucose logs during his next visit.  - I advised patient to maintain close follow up with System, Provider Not In for primary care needs.  Follow up plan: - Return in about 5 weeks (around 07/13/2017) for meter, and logs.  Marquis Lunch, MD Phone: [phone]  Fax: [phone]   06/08/2017, 1:06 PM This note was partially dictated with voice recognition software. Similar sounding words can be transcribed inadequately or may not  be corrected upon review.

## 2017-06-12 ENCOUNTER — Other Ambulatory Visit: Payer: Self-pay | Admitting: Physician Assistant

## 2017-07-04 ENCOUNTER — Encounter: Payer: Self-pay | Attending: "Endocrinology | Admitting: Nutrition

## 2017-07-04 VITALS — Ht 71.0 in | Wt 238.0 lb

## 2017-07-04 DIAGNOSIS — E118 Type 2 diabetes mellitus with unspecified complications: Principal | ICD-10-CM

## 2017-07-04 DIAGNOSIS — IMO0002 Reserved for concepts with insufficient information to code with codable children: Secondary | ICD-10-CM

## 2017-07-04 DIAGNOSIS — E1165 Type 2 diabetes mellitus with hyperglycemia: Secondary | ICD-10-CM

## 2017-07-04 DIAGNOSIS — Z951 Presence of aortocoronary bypass graft: Secondary | ICD-10-CM

## 2017-07-04 DIAGNOSIS — E669 Obesity, unspecified: Secondary | ICD-10-CM

## 2017-07-04 NOTE — Patient Instructions (Signed)
Goals Follow My Plate  Eat 3-4 carb choices per meal-45-60 grams per meal Increase fiber in diet Avoid processed and high salt foods Eat three meals on time as discussed. Drink only water  Walk 10,000 steps most days of week. Avoid saturated  Get A1C down to 6% Lose 1-2 lbs per week

## 2017-07-04 NOTE — Progress Notes (Signed)
  Medical Nutrition Therapy:  Appt start time: 1100 end time:  1230.   Assessment:  Primary concerns today: DiabetesType 2. Here with his wife. Dx 2001. Had 2 heart attacks and  Quadruple ,bypass 12-22-16.  He has never met with an RDN/CDE before for diabetes education. Sees. Dr. Dorris Fetch, Endocrinology. Last A1C 7.9%.   Has issues with right hip pain. Has a spider but on right buttock that is causing him pain and on medications. Changing to new Provider in [place], Dr. Radford Pax. Eats three meals per day. Previously had been eating 1 meal per day. Works long hours and travels out of town for work often. Meds: 70 units Toujeo Max and 1000 mg BID. He has lost about 7-10 lbs in the last month or so. Has changed eating habits with less processed foods, low salt, higher fiber and cutting out snacks. Drinking only water now instead of sweet tea. He and his wife are motivated to make lifestyle changes. Currently has a cough.   Lab Results  Component Value Date   HGBA1C 8.5 (H) 12/21/2016   Lipid Panel     Component Value Date/Time   CHOL 202 (H) 12/21/2016 0526   TRIG 444 (H) 12/21/2016 0526   HDL 37 (L) 12/21/2016 0526   CHOLHDL 5.5 12/21/2016 0526   VLDL UNABLE TO CALCULATE IF TRIGLYCERIDE OVER 400 mg/dL 12/21/2016 0526   LDLCALC UNABLE TO CALCULATE IF TRIGLYCERIDE OVER 400 mg/dL 12/21/2016 0526     Preferred Learning Style:  No preference indicated   Learning Readiness:  Ready  Change in progress   MEDICATIONS: See list   DIETARY INTAKE:  24-hr recall:  B ( AM): Cherrios with almond  Or 2% milk, 2 cups, Water, coffee with coffee sweetner Snk ( AM):  L ( PM): PB sandwich on whole wheat and banana small,  water Snk ( PM): none D ( PM):  Steamed vegebles with shrimp, low sodium with shrimp, water, rice 1/2 c fried Snk ( PM):  Beverages: water  Usual physical activity:  Does walking  3 miles per day on his job.  Estimated energy needs: 1800  calories 200 g carbohydrates 135 g  protein 50 g fat  Progress Towards Goal(s):  In progress.   Nutritional Diagnosis:  NB-1.1 Food and nutrition-related knowledge deficit As related to DIabets.  As evidenced by A1C 8.3%.    Intervention:. Nutrition and Diabetes education provided on My Plate, CHO counting, meal planning, portion sizes, timing of meals, avoiding snacks between meals unless having a low blood sugar, target ranges for A1C and blood sugars, signs/symptoms and treatment of hyper/hypoglycemia, monitoring blood sugars, taking medications as prescribed, benefits of exercising 30 minutes per day and prevention of complications of DM.  Goals Follow My Plate  Eat 3-4 carb choices per meal-45-60 grams per meal Increase fiber in diet Avoid processed and high salt foods Eat three meals on time as discussed. Drink only water  Walk 10,000 steps most days of week. Avoid saturated  Get A1C down to 6% Lose 1-2 lbs per week    Teaching Method Utilized:  Visual Auditory Hands on  Handouts given during visit include:  The Plate Method   Meal Plan  Diabetes Booklet  Barriers to learning/adherence to lifestyle change: none  Demonstrated degree of understanding via:  Teach Back   Monitoring/Evaluation:  Dietary intake, exercise, meal planning, and body weight in 7 day(s).

## 2017-07-05 ENCOUNTER — Encounter: Payer: Self-pay | Admitting: Cardiovascular Disease

## 2017-07-12 ENCOUNTER — Encounter: Payer: Self-pay | Admitting: "Endocrinology

## 2017-07-12 ENCOUNTER — Ambulatory Visit: Payer: Self-pay | Admitting: Nutrition

## 2017-07-12 ENCOUNTER — Ambulatory Visit: Payer: BLUE CROSS/BLUE SHIELD | Admitting: "Endocrinology

## 2017-07-21 ENCOUNTER — Other Ambulatory Visit: Payer: Self-pay | Admitting: Adult Health

## 2017-07-26 ENCOUNTER — Telehealth: Payer: Self-pay | Admitting: "Endocrinology

## 2017-07-26 MED ORDER — INSULIN GLARGINE 300 UNIT/ML ~~LOC~~ SOPN
70.0000 [IU] | PEN_INJECTOR | Freq: Every day | SUBCUTANEOUS | 2 refills | Status: DC
Start: 2017-07-26 — End: 2017-08-01

## 2017-07-26 NOTE — Telephone Encounter (Signed)
Brad Olson needs a refill on his Insulin Glargine (TOUJEO MAX SOLOSTAR) 300 UNIT/ML SOPN  Please advise?

## 2017-08-01 ENCOUNTER — Other Ambulatory Visit: Payer: Self-pay

## 2017-08-01 MED ORDER — INSULIN GLARGINE 300 UNIT/ML ~~LOC~~ SOPN: 70.0000 [IU] | PEN_INJECTOR | Freq: Every day | SUBCUTANEOUS | 2 refills | Status: DC

## 2017-08-02 ENCOUNTER — Other Ambulatory Visit: Payer: Self-pay

## 2017-08-02 MED ORDER — INSULIN GLARGINE 300 UNIT/ML ~~LOC~~ SOPN: 70.0000 [IU] | PEN_INJECTOR | Freq: Every day | SUBCUTANEOUS | 2 refills | Status: DC

## 2017-08-03 ENCOUNTER — Ambulatory Visit: Payer: BLUE CROSS/BLUE SHIELD | Admitting: "Endocrinology

## 2017-08-13 ENCOUNTER — Encounter: Payer: Self-pay | Admitting: "Endocrinology

## 2017-08-19 ENCOUNTER — Encounter: Payer: Self-pay | Admitting: Cardiovascular Disease

## 2017-08-19 ENCOUNTER — Ambulatory Visit (INDEPENDENT_AMBULATORY_CARE_PROVIDER_SITE_OTHER): Payer: BLUE CROSS/BLUE SHIELD | Admitting: Cardiovascular Disease

## 2017-08-19 VITALS — BP 132/80 | HR 76 | Ht 71.0 in | Wt 236.0 lb

## 2017-08-19 DIAGNOSIS — E118 Type 2 diabetes mellitus with unspecified complications: Secondary | ICD-10-CM | POA: Diagnosis not present

## 2017-08-19 DIAGNOSIS — I25708 Atherosclerosis of coronary artery bypass graft(s), unspecified, with other forms of angina pectoris: Secondary | ICD-10-CM

## 2017-08-19 DIAGNOSIS — Z794 Long term (current) use of insulin: Secondary | ICD-10-CM | POA: Diagnosis not present

## 2017-08-19 DIAGNOSIS — I739 Peripheral vascular disease, unspecified: Secondary | ICD-10-CM | POA: Diagnosis not present

## 2017-08-19 DIAGNOSIS — I428 Other cardiomyopathies: Secondary | ICD-10-CM

## 2017-08-19 DIAGNOSIS — Z716 Tobacco abuse counseling: Secondary | ICD-10-CM | POA: Diagnosis not present

## 2017-08-19 DIAGNOSIS — E1165 Type 2 diabetes mellitus with hyperglycemia: Secondary | ICD-10-CM | POA: Diagnosis not present

## 2017-08-19 DIAGNOSIS — E785 Hyperlipidemia, unspecified: Secondary | ICD-10-CM

## 2017-08-19 DIAGNOSIS — I10 Essential (primary) hypertension: Secondary | ICD-10-CM

## 2017-08-19 DIAGNOSIS — IMO0002 Reserved for concepts with insufficient information to code with codable children: Secondary | ICD-10-CM

## 2017-08-19 NOTE — Progress Notes (Signed)
SUBJECTIVE: The patient presents for routine follow-up.  He underwent four-vessel CABG on 12/22/16.  TEE performed in the perioperative period demonstrated moderately reduced left ventricular systolic function, LVEF 35-40%.  He recently lost his job and has been under a lot of stress due to this.  He says he has cut back on his cigarette smoking.  He has had some nonexertional precordial pain which last less than a minute.  He has not had to use nitroglycerin.  He is also developed an abdominal wall hernia which he is concerned about.  He underwent lower extremity imaging in [place].  There is no evidence of DVT.  He did have severe left lower extremity peripheral arterial disease and moderate right lower extremity peripheral arterial disease.  He denies leg swelling.  He has chronic exertional dyspnea which is stable.  He is try to increase his physical activity levels and has been deer hunting lately.     Review of Systems: As per "subjective", otherwise negative.  Allergies  Allergen Reactions  . Nicotine Rash    Nicotine patch only.    Current Outpatient Medications  Medication Sig Dispense Refill  . aspirin EC 81 MG tablet Take 1 tablet (81 mg total) by mouth daily. 90 tablet 3  . atorvastatin (LIPITOR) 40 MG tablet Take 1 tablet (40 mg total) by mouth daily. 90 tablet 3  . clindamycin (CLEOCIN) 300 MG capsule TAKE ONE CAPSULE BY MOUTH EVERY 8 HOURS  0  . clopidogrel (PLAVIX) 75 MG tablet Take 1 tablet (75 mg total) by mouth daily. 30 tablet 11  . DiphenhydrAMINE HCl, Sleep, (SLEEP AID) 50 MG CAPS Take 50 mg by mouth at bedtime.    . Insulin Glargine (TOUJEO MAX SOLOSTAR) 300 UNIT/ML SOPN Inject 70 Units into the skin at bedtime. 6 mL 2  . losartan (COZAAR) 50 MG tablet Take 50 mg by mouth daily.    . Melatonin 10 MG TABS Take 1 tablet by mouth at bedtime.    . metFORMIN (GLUCOPHAGE) 1000 MG tablet Take 1 tablet (1,000 mg total) by mouth 2 (two) times daily with a meal.  60 tablet 2  . metoprolol tartrate (LOPRESSOR) 25 MG tablet TAKE 1 TABLET BY MOUTH TWICE A DAY 180 tablet 1  . Multiple Vitamin (MULTIVITAMIN WITH MINERALS) TABS tablet Take 1 tablet by mouth daily.    Marland Kitchen omega-3 acid ethyl esters (LOVAZA) 1 g capsule TAKE 1 CAPSULE (1 G TOTAL) BY MOUTH 2 (TWO) TIMES DAILY. 180 capsule 3   No current facility-administered medications for this visit.     Past Medical History:  Diagnosis Date  . Diabetes mellitus without complication (HCC)   . Hyperlipidemia   . Obesity   . Tobacco abuse     Past Surgical History:  Procedure Laterality Date  . ABDOMINAL AORTOGRAM N/A 12/22/2016   Procedure: Abdominal Aortogram;  Surgeon: Lennette Bihari, MD;  Location: [facility] INVASIVE CV LAB;  Service: Cardiovascular;  Laterality: N/A;  . COLON RESECTION     due to bowel rupture  . CORONARY ARTERY BYPASS GRAFT N/A 12/22/2016   Procedure: CORONARY ARTERY BYPASS GRAFTING (CABG) x four using left internal mammary artery and right greater saphenous leg vein using endoscope.  Lima-LAD, SVG-PD, SVG-OM, SVG-Diag.;  Surgeon: Delight Ovens, MD;  Location: [facility] OR;  Service: Open Heart Surgery;  Laterality: N/A;  . IABP INSERTION N/A 12/22/2016   Procedure: IABP Insertion;  Surgeon: Lennette Bihari, MD;  Location: [facility] INVASIVE CV LAB;  Service: Cardiovascular;  Laterality: N/A;  . LEFT HEART CATH AND CORONARY ANGIOGRAPHY N/A 12/22/2016   Procedure: Left Heart Cath and Coronary Angiography;  Surgeon: Lennette Biharihomas A Kelly, MD;  Location: [facility] INVASIVE CV LAB;  Service: Cardiovascular;  Laterality: N/A;  . TEE WITHOUT CARDIOVERSION N/A 12/22/2016   Procedure: TRANSESOPHAGEAL ECHOCARDIOGRAM (TEE);  Surgeon: Delight OvensEdward B Gerhardt, MD;  [facility];  Service: Open Heart Surgery;  Laterality: N/A;    Social History   Socioeconomic History  . Marital status: Married    Spouse name: Not on file  . Number of children: Not on file  . Years of education: Not on file  . Highest education level: Not on  file  Social Needs  . Financial resource strain: Not on file  . Food insecurity - worry: Not on file  . Food insecurity - inability: Not on file  . Transportation needs - medical: Not on file  . Transportation needs - non-medical: Not on file  Occupational History  . Not on file  Tobacco Use  . Smoking status: Current Every Day Smoker    Packs/day: 1.00    Years: 30.00    Pack years: 30.00    Types: Cigarettes    Start date: 07/16/1982  . Smokeless tobacco: Never Used  . Tobacco comment: >30 years  Substance and Sexual Activity  . Alcohol use: Yes    Comment: Very rarely  . Drug use: No  . Sexual activity: Not on file  Other Topics Concern  . Not on file  Social History Narrative  . Not on file     Vitals:   08/19/17 0901  BP: 132/80  Pulse: 76  SpO2: 90%  Weight: 236 lb (107 kg)  Height: 5\' 11"  (1.803 m)    Wt Readings from Last 3 Encounters:  08/19/17 236 lb (107 kg)  07/04/17 238 lb (108 kg)  06/08/17 239 lb (108.4 kg)     PHYSICAL EXAM General: NAD HEENT: Normal. Neck: No JVD, no thyromegaly. Lungs: Clear to auscultation bilaterally with normal respiratory effort. CV: Regular rate and rhythm, normal S1/S2, no S3/S4, no murmur. No pretibial or periankle edema. Abdomen: Soft, nontender, no distention.  Neurologic: Alert and oriented.  Psych: Normal affect. Skin: Normal. Musculoskeletal: No gross deformities.    ECG: Most recent ECG reviewed.   Labs: Lab Results  Component Value Date/Time   K 4.5 12/27/2016 09:43 AM   BUN 35 (H) 12/27/2016 09:43 AM   CREATININE 1.00 12/27/2016 09:43 AM   ALT 35 12/23/2016 03:59 AM   HGB 10.8 (L) 12/29/2016 02:14 AM     Lipids: Lab Results  Component Value Date/Time   LDLCALC UNABLE TO CALCULATE IF TRIGLYCERIDE OVER 400 mg/dL 04/54/098104/06/2017 19:1405:26 AM   CHOL 202 (H) 12/21/2016 05:26 AM   TRIG 444 (H) 12/21/2016 05:26 AM   HDL 37 (L) 12/21/2016 05:26 AM       ASSESSMENT AND PLAN: 1. Coronary artery disease  status post 4 vessel CABG: Overall symptomatically stable. Continue ASA. Given history of MI, I will continue Plavix for a minimum of 1 year, to be discontinued at the end of April 2019. Continue Lipitor 40 mg, losartan, and metoprolol 25 mg twice daily. I will reassess cardiac function to assess for interval improvement in LVEF with echocardiography.  2. Hypertension: Controlled.  Blood pressures at home generally range less than 130/80.  No changes to therapy.  3. Hyperlipidemia: Continue Lipitor 40 mg.  Lipids due to be repeated in the near future.  4. Tobacco abuse: Cessation counseling previously provided.  5. Poorly controlled type 2 diabetes mellitus: Now being managed by endocrinology.  He is currently taking insulin glargine and metformin.  6.  Peripheral arterial disease: Continue aspirin and statin.  Tobacco cessation counseling provided.  I will make a referral to Dr. Allyson Sabal.    Disposition: Follow up 6 months.    Prentice Docker, M.D., F.A.C.C.

## 2017-08-19 NOTE — Patient Instructions (Signed)

## 2017-08-19 NOTE — Addendum Note (Signed)
Addended by: Abelino Derrick R on: 08/19/2017 09:33 AM   Modules accepted: Orders

## 2017-08-20 LAB — COMPLETE METABOLIC PANEL WITH GFR
AG RATIO: 1.7 (calc) (ref 1.0–2.5)
ALBUMIN MSPROF: 4.3 g/dL (ref 3.6–5.1)
ALT: 18 U/L (ref 9–46)
AST: 20 U/L (ref 10–35)
Alkaline phosphatase (APISO): 61 U/L (ref 40–115)
BUN: 22 mg/dL (ref 7–25)
CALCIUM: 9.7 mg/dL (ref 8.6–10.3)
CO2: 30 mmol/L (ref 20–32)
CREATININE: 0.94 mg/dL (ref 0.70–1.33)
Chloride: 100 mmol/L (ref 98–110)
GFR, EST NON AFRICAN AMERICAN: 91 mL/min/{1.73_m2} (ref >=60)
GFR, Est African American: 105 mL/min/{1.73_m2} (ref >=60)
GLOBULIN: 2.6 g/dL (ref 1.9–3.7)
Glucose, Bld: 196 mg/dL — ABNORMAL HIGH (ref 65–99)
POTASSIUM: 4.8 mmol/L (ref 3.5–5.3)
SODIUM: 138 mmol/L (ref 135–146)
Total Bilirubin: 0.5 mg/dL (ref 0.2–1.2)
Total Protein: 6.9 g/dL (ref 6.1–8.1)

## 2017-08-20 LAB — T4, FREE: FREE T4: 1 ng/dL (ref 0.8–1.8)

## 2017-08-20 LAB — LIPID PANEL
CHOL/HDL RATIO: 3.2 (calc) (ref <5.0)
Cholesterol: 115 mg/dL (ref <200)
HDL: 36 mg/dL — ABNORMAL LOW (ref >40)
LDL CHOLESTEROL (CALC): 52 mg/dL
NON-HDL CHOLESTEROL (CALC): 79 mg/dL (ref <130)
TRIGLYCERIDES: 197 mg/dL — AB (ref <150)

## 2017-08-20 LAB — TSH: TSH: 1.21 mIU/L (ref 0.40–4.50)

## 2017-08-20 LAB — HEMOGLOBIN A1C
EAG (MMOL/L): 9.2 (calc)
Hgb A1c MFr Bld: 7.4 % of total Hgb — ABNORMAL HIGH (ref <5.7)
Mean Plasma Glucose: 166 (calc)

## 2017-08-20 LAB — MICROALBUMIN / CREATININE URINE RATIO
Creatinine, Urine: 131 mg/dL (ref 20–320)
Microalb Creat Ratio: 5 mcg/mg creat (ref <30)
Microalb, Ur: 0.7 mg/dL

## 2017-08-22 ENCOUNTER — Ambulatory Visit: Payer: BLUE CROSS/BLUE SHIELD | Admitting: Cardiovascular Disease

## 2017-08-22 ENCOUNTER — Ambulatory Visit: Payer: BLUE CROSS/BLUE SHIELD | Admitting: "Endocrinology

## 2017-08-23 ENCOUNTER — Other Ambulatory Visit: Payer: Self-pay | Admitting: "Endocrinology

## 2017-08-26 ENCOUNTER — Other Ambulatory Visit: Payer: Self-pay

## 2017-08-26 ENCOUNTER — Ambulatory Visit (HOSPITAL_COMMUNITY)
Admission: RE | Admit: 2017-08-26 | Discharge: 2017-08-26 | Disposition: A | Discharge status: Home / Self Care | Payer: BLUE CROSS/BLUE SHIELD | Source: Ambulatory Visit | Attending: Cardiovascular Disease | Admitting: Cardiovascular Disease

## 2017-08-26 DIAGNOSIS — E785 Hyperlipidemia, unspecified: Secondary | ICD-10-CM | POA: Insufficient documentation

## 2017-08-26 DIAGNOSIS — E119 Type 2 diabetes mellitus without complications: Secondary | ICD-10-CM | POA: Insufficient documentation

## 2017-08-26 DIAGNOSIS — I119 Hypertensive heart disease without heart failure: Secondary | ICD-10-CM | POA: Insufficient documentation

## 2017-08-26 DIAGNOSIS — Z951 Presence of aortocoronary bypass graft: Secondary | ICD-10-CM | POA: Insufficient documentation

## 2017-08-26 DIAGNOSIS — I252 Old myocardial infarction: Secondary | ICD-10-CM | POA: Diagnosis not present

## 2017-08-26 DIAGNOSIS — I428 Other cardiomyopathies: Secondary | ICD-10-CM | POA: Insufficient documentation

## 2017-08-26 DIAGNOSIS — I739 Peripheral vascular disease, unspecified: Secondary | ICD-10-CM

## 2017-08-26 DIAGNOSIS — F17210 Nicotine dependence, cigarettes, uncomplicated: Secondary | ICD-10-CM | POA: Diagnosis not present

## 2017-08-26 MED ORDER — PERFLUTREN LIPID MICROSPHERE
1.0000 mL | INTRAVENOUS | Status: AC | PRN
  Administered 2017-08-26: 1 mL via INTRAVENOUS
  Filled 2017-08-26: qty 10

## 2017-08-26 NOTE — Progress Notes (Signed)
*  PRELIMINARY RESULTS* Echocardiogram 2D Echocardiogram with definity has been performed.  Jeryl Columbia 08/26/2017, 10:57 AM

## 2017-08-29 ENCOUNTER — Ambulatory Visit (INDEPENDENT_AMBULATORY_CARE_PROVIDER_SITE_OTHER): Payer: BLUE CROSS/BLUE SHIELD | Admitting: "Endocrinology

## 2017-08-29 ENCOUNTER — Encounter: Payer: Self-pay | Admitting: "Endocrinology

## 2017-08-29 ENCOUNTER — Telehealth: Payer: Self-pay

## 2017-08-29 VITALS — BP 135/78 | HR 77 | Ht 71.0 in | Wt 242.0 lb

## 2017-08-29 DIAGNOSIS — E1159 Type 2 diabetes mellitus with other circulatory complications: Secondary | ICD-10-CM | POA: Diagnosis not present

## 2017-08-29 DIAGNOSIS — E782 Mixed hyperlipidemia: Secondary | ICD-10-CM | POA: Diagnosis not present

## 2017-08-29 DIAGNOSIS — I10 Essential (primary) hypertension: Secondary | ICD-10-CM

## 2017-08-29 DIAGNOSIS — Z6833 Body mass index (BMI) 33.0-33.9, adult: Secondary | ICD-10-CM

## 2017-08-29 DIAGNOSIS — E6609 Other obesity due to excess calories: Secondary | ICD-10-CM

## 2017-08-29 NOTE — Telephone Encounter (Signed)
-----   Message from Laqueta Linden, MD sent at 08/26/2017  2:50 PM EST ----- Normal cardiac function.

## 2017-08-29 NOTE — Patient Instructions (Signed)

## 2017-08-29 NOTE — Telephone Encounter (Signed)
Called pt. No answer. Left message for pt to return call.  

## 2017-08-29 NOTE — Progress Notes (Signed)
Subjective:    Patient ID: Brad Olson, male    DOB: 06-02-1961.  he is being seen in consultation for management of currently uncontrolled symptomatic diabetes requested by  System, Provider Not In.   Past Medical History:  Diagnosis Date  . Diabetes mellitus without complication (HCC)   . Hyperlipidemia   . Obesity   . Tobacco abuse    Past Surgical History:  Procedure Laterality Date  . ABDOMINAL AORTOGRAM N/A 12/22/2016   Procedure: Abdominal Aortogram;  Surgeon: Lennette Bihari, MD;  Location: [facility] INVASIVE CV LAB;  Service: Cardiovascular;  Laterality: N/A;  . COLON RESECTION     due to bowel rupture  . CORONARY ARTERY BYPASS GRAFT N/A 12/22/2016   Procedure: CORONARY ARTERY BYPASS GRAFTING (CABG) x four using left internal mammary artery and right greater saphenous leg vein using endoscope.  Lima-LAD, SVG-PD, SVG-OM, SVG-Diag.;  Surgeon: Delight Ovens, MD;  Location: [facility] OR;  Service: Open Heart Surgery;  Laterality: N/A;  . IABP INSERTION N/A 12/22/2016   Procedure: IABP Insertion;  Surgeon: Lennette Bihari, MD;  Location: [facility] INVASIVE CV LAB;  Service: Cardiovascular;  Laterality: N/A;  . LEFT HEART CATH AND CORONARY ANGIOGRAPHY N/A 12/22/2016   Procedure: Left Heart Cath and Coronary Angiography;  Surgeon: Lennette Bihari, MD;  Location: [facility] INVASIVE CV LAB;  Service: Cardiovascular;  Laterality: N/A;  . TEE WITHOUT CARDIOVERSION N/A 12/22/2016   Procedure: TRANSESOPHAGEAL ECHOCARDIOGRAM (TEE);  Surgeon: Delight Ovens, MD;  Location: [facility] OR;  Service: Open Heart Surgery;  Laterality: N/A;   Social History   Socioeconomic History  . Marital status: Married    Spouse name: None  . Number of children: None  . Years of education: None  . Highest education level: None  Social Needs  . Financial resource strain: None  . Food insecurity - worry: None  . Food insecurity - inability: None  . Transportation needs - medical: None  . Transportation needs - non-medical: None   Occupational History  . None  Tobacco Use  . Smoking status: Current Every Day Smoker    Packs/day: 1.00    Years: 30.00    Pack years: 30.00    Types: Cigarettes    Start date: 07/16/1982  . Smokeless tobacco: Never Used  . Tobacco comment: >30 years  Substance and Sexual Activity  . Alcohol use: Yes    Comment: Very rarely  . Drug use: No  . Sexual activity: None  Other Topics Concern  . None  Social History Narrative  . None   Outpatient Encounter Medications as of 08/29/2017  Medication Sig  . Probiotic Product (PROBIOTIC-10) CAPS Take by mouth.  Marland Kitchen aspirin EC 81 MG tablet Take 1 tablet (81 mg total) by mouth daily.  Marland Kitchen atorvastatin (LIPITOR) 40 MG tablet Take 1 tablet (40 mg total) by mouth daily.  . clopidogrel (PLAVIX) 75 MG tablet Take 1 tablet (75 mg total) by mouth daily.  . DiphenhydrAMINE HCl, Sleep, (SLEEP AID) 50 MG CAPS Take 50 mg by mouth at bedtime.  . Insulin Glargine (TOUJEO MAX SOLOSTAR) 300 UNIT/ML SOPN Inject 70 Units into the skin at bedtime.  Marland Kitchen losartan (COZAAR) 50 MG tablet Take 50 mg by mouth daily.  . Melatonin 10 MG TABS Take 1 tablet by mouth at bedtime.  . metFORMIN (GLUCOPHAGE) 1000 MG tablet TAKE 1 TABLET BY MOUTH TWICE A DAY WITH A MEAL  . metoprolol tartrate (LOPRESSOR) 25 MG tablet TAKE 1 TABLET BY MOUTH TWICE A  DAY  . Multiple Vitamin (MULTIVITAMIN WITH MINERALS) TABS tablet Take 1 tablet by mouth daily.  Marland Kitchen omega-3 acid ethyl esters (LOVAZA) 1 g capsule TAKE 1 CAPSULE (1 G TOTAL) BY MOUTH 2 (TWO) TIMES DAILY.  . [DISCONTINUED] clindamycin (CLEOCIN) 300 MG capsule TAKE ONE CAPSULE BY MOUTH EVERY 8 HOURS   No facility-administered encounter medications on file as of 08/29/2017.     ALLERGIES: Allergies  Allergen Reactions  . Nicotine Rash    Nicotine patch only.    VACCINATION STATUS:  There is no immunization history on file for this patient.  Diabetes  He presents for his follow-up diabetic visit. He has type 2 diabetes mellitus.  Onset time: He was diagnosed at approximate age of 38 years. His disease course has been improving. There are no hypoglycemic associated symptoms. Pertinent negatives for hypoglycemia include no confusion, headaches, pallor or seizures. Associated symptoms include foot paresthesias, polydipsia and polyuria. Pertinent negatives for diabetes include no chest pain, no fatigue, no polyphagia and no weakness. There are no hypoglycemic complications. Symptoms are improving. Diabetic complications include heart disease and PVD. (He is status post quadruple bypass of coronary artery disease in April 2018.) Risk factors for coronary artery disease include dyslipidemia, diabetes mellitus, family history, male sex, obesity, hypertension, sedentary lifestyle and tobacco exposure. Current diabetic treatment includes insulin injections and oral agent (monotherapy). His weight is stable. He is following a generally unhealthy diet. When asked about meal planning, he reported none. He has not had a previous visit with a dietitian. He never participates in exercise. His breakfast blood glucose range is generally 180-200 mg/dl. His bedtime blood glucose range is generally >200 mg/dl. His overall blood glucose range is 180-200 mg/dl. An ACE inhibitor/angiotensin II receptor blocker is being taken. He sees a podiatrist. Hyperlipidemia  This is a chronic problem. The current episode started more than 1 year ago. The problem is uncontrolled (He has severe hypertriglyceridemia over 444.). Recent lipid tests were reviewed and are high. Exacerbating diseases include diabetes and obesity. Factors aggravating his hyperlipidemia include smoking. Pertinent negatives include no chest pain, myalgias or shortness of breath. Current antihyperlipidemic treatment includes statins and bile acid squestrants. Risk factors for coronary artery disease include diabetes mellitus, dyslipidemia, family history, male sex, obesity, a sedentary lifestyle and  hypertension.  Hypertension  This is a chronic problem. The current episode started more than 1 year ago. The problem is uncontrolled. Pertinent negatives include no chest pain, headaches, neck pain, palpitations or shortness of breath. Past treatments include ACE inhibitors and beta blockers. Hypertensive end-organ damage includes CAD/MI and PVD.     Review of Systems  Constitutional: Negative for chills, fatigue, fever and unexpected weight change.  HENT: Negative for dental problem, mouth sores and trouble swallowing.   Eyes: Negative for visual disturbance.  Respiratory: Negative for cough, choking, chest tightness, shortness of breath and wheezing.   Cardiovascular: Negative for chest pain, palpitations and leg swelling.  Gastrointestinal: Negative for abdominal distention, abdominal pain, constipation, diarrhea, nausea and vomiting.  Endocrine: Positive for polydipsia and polyuria. Negative for polyphagia.  Genitourinary: Negative for dysuria, flank pain, hematuria and urgency.  Musculoskeletal: Negative for back pain, gait problem, myalgias and neck pain.  Skin: Negative for pallor, rash and wound.  Neurological: Negative for seizures, syncope, weakness, numbness and headaches.  Psychiatric/Behavioral: Negative.  Negative for confusion and dysphoric mood.    Objective:    BP 135/78   Pulse 77   Ht 5\' 11"  (1.803 m)   Wt 242  lb (109.8 kg)   BMI 33.75 kg/m   Wt Readings from Last 3 Encounters:  08/29/17 242 lb (109.8 kg)  08/19/17 236 lb (107 kg)  07/04/17 238 lb (108 kg)     Physical Exam  Constitutional: He is oriented to person, place, and time. He appears well-developed and well-nourished. He is cooperative. No distress.  HENT:  Head: Normocephalic and atraumatic.  Eyes: EOM are normal.  Neck: Normal range of motion. Neck supple. No tracheal deviation present. No thyromegaly present.  Cardiovascular: Normal rate, S1 normal, S2 normal and normal heart sounds. Exam  reveals no gallop.  No murmur heard. Pulses:      Dorsalis pedis pulses are 1+ on the right side, and 1+ on the left side.       Posterior tibial pulses are 1+ on the right side, and 1+ on the left side.  Pulmonary/Chest: Breath sounds normal. No respiratory distress. He has no wheezes.  Abdominal: Soft. Bowel sounds are normal. He exhibits no distension. There is no tenderness. There is no guarding and no CVA tenderness.  Musculoskeletal: He exhibits no edema.       Right shoulder: He exhibits no swelling and no deformity.  Neurological: He is alert and oriented to person, place, and time. He has normal strength and normal reflexes. No cranial nerve deficit or sensory deficit. Gait normal.  Skin: Skin is warm and dry. No rash noted. No cyanosis. Nails show no clubbing.  Psychiatric: His speech is normal. Cognition and memory are normal.  Reluctant affect.    CMP ( most recent) CMP     Component Value Date/Time   NA 138 08/19/2017 1023   K 4.8 08/19/2017 1023   CL 100 08/19/2017 1023   CO2 30 08/19/2017 1023   GLUCOSE 196 (H) 08/19/2017 1023   BUN 22 08/19/2017 1023   CREATININE 0.94 08/19/2017 1023   CALCIUM 9.7 08/19/2017 1023   PROT 6.9 08/19/2017 1023   ALBUMIN 3.4 (L) 12/23/2016 0359   AST 20 08/19/2017 1023   ALT 18 08/19/2017 1023   ALKPHOS 26 (L) 12/23/2016 0359   BILITOT 0.5 08/19/2017 1023   GFRNONAA 91 08/19/2017 1023   GFRAA 105 08/19/2017 1023     Diabetic Labs (most recent): Lab Results  Component Value Date   HGBA1C 7.4 (H) 08/19/2017   HGBA1C 8.5 (H) 12/21/2016   HGBA1C 8.5 (H) 12/20/2016     Lipid Panel ( most recent) Lipid Panel     Component Value Date/Time   CHOL 115 08/19/2017 1023   TRIG 197 (H) 08/19/2017 1023   HDL 36 (L) 08/19/2017 1023   CHOLHDL 3.2 08/19/2017 1023   VLDL UNABLE TO CALCULATE IF TRIGLYCERIDE OVER 400 mg/dL 56/21/3086 5784   LDLCALC UNABLE TO CALCULATE IF TRIGLYCERIDE OVER 400 mg/dL 69/62/9528 4132      Assessment &  Plan:   1. DM type 2 causing vascular disease (HCC)  - Patient has currently uncontrolled symptomatic type 2 DM since  56 years of age. - He came with improved blood glucose profile, A1c improving to 7.4% from 8.5%. - His recent labs reviewed showing normal renal function. -his diabetes is complicated by coronary artery disease status post quadruple bypass graft in April 2018, severe peripheral arterial disease, obesity/sedentary life and Dhyan Rougeau remains at a high risk for more acute and chronic complications which include CAD, CVA, CKD, retinopathy, and neuropathy. These are all discussed in detail with the patient.  - I have counseled him on diet management  and weight loss, by adopting a carbohydrate restricted/protein rich diet.  -  Suggestion is made for him to avoid simple carbohydrates  from his diet including Cakes, Sweet Desserts / Pastries, Ice Cream, Soda (diet and regular), Sweet Tea, Candies, Chips, Cookies, Store Bought Juices, Alcohol in Excess of  1-2 drinks a day, Artificial Sweeteners, and "Sugar-free" Products. This will help patient to have stable blood glucose profile and potentially avoid unintended weight gain.   - I encouraged him to switch to  unprocessed or minimally processed complex starch and increased protein intake (animal or plant source), fruits, and vegetables.  - he is advised to stick to a routine mealtimes to eat 3 meals  a day and avoid unnecessary snacks ( to snack only to correct hypoglycemia).   - I have approached him with the following individualized plan to manage diabetes and patient agrees:   - He has responded to adjusted basal insulin, average blood glucose 187. -  I  will proceed to readjust his basal insulin , and increase  Toujeo to 80 units daily at bedtime  , and continue strict monitoring of glucose 2 times a day-before breakfast and at bedtime. - Based on his blood glucose profile, he will not require bolus insulin at this  time.  -Patient is encouraged to call clinic for blood glucose levels less than 70 or above 300 mg /dl. - I will continue  metformin 1000 mg by mouth twice a day, therapeutically suitable for patient. -  He has not been suitable candidate for incretin therapy  due to severe hypertriglyceridemia of 444. His triglycerides are improving to 197, may qualify for weekly  incretin therapy , next visit.  I dvised him to avoid fried food and butter. - Patient specific target  A1c;  LDL, HDL, Triglycerides, and  Waist Circumference were discussed in detail.  2) BP/HTN:  controlled. Continue current medications including ACEI/ARB. 3) Lipids/HPL:   Uncontrolled, triglycerides 444.   Patient is advised to continue statins and Lovaza. 4)  Weight/Diet: CDE Consult has been initiated , exercise, and detailed carbohydrates information provided.  5) Chronic Care/Health Maintenance:  -he  is on ACEI/ARB and Statin medications and  is encouraged to continue to follow up with Ophthalmology, Dentist,  Podiatrist at least yearly or according to recommendations, and advised to  quit smoking. I have recommended yearly flu vaccine and pneumonia vaccination at least every 5 years; moderate intensity exercise for up to 150 minutes weekly; and  sleep for at least 7 hours a day.  - Time spent with the patient: 25 min, of which >50% was spent in reviewing his sugar logs , discussing his hypo- and hyper-glycemic episodes, reviewing his current and  previous labs and insulin doses and developing a plan to avoid hypo- and hyper-glycemia.   - I advised patient to maintain close follow up with System, Provider Not In for primary care needs.  Follow up plan: - Return in about 3 months (around 11/27/2017) for follow up with pre-visit labs, meter, and logs.  Marquis LunchGebre Nida, MD Phone: [phone]  Fax: [phone]   08/29/2017, 4:52 PM This note was partially dictated with voice recognition software. Similar sounding words can be  transcribed inadequately or may not  be corrected upon review.

## 2017-09-16 ENCOUNTER — Other Ambulatory Visit: Payer: Self-pay

## 2017-09-16 MED ORDER — INSULIN GLARGINE 300 UNIT/ML ~~LOC~~ SOPN: 80.0000 [IU] | PEN_INJECTOR | Freq: Every day | SUBCUTANEOUS | 2 refills | Status: DC

## 2017-09-27 ENCOUNTER — Other Ambulatory Visit: Payer: Self-pay

## 2017-09-27 MED ORDER — GLUCOSE BLOOD VI STRP: 1.0000 | ORAL_STRIP | Freq: Four times a day (QID) | 5 refills | Status: AC

## 2017-09-28 ENCOUNTER — Other Ambulatory Visit: Payer: Self-pay

## 2017-10-10 ENCOUNTER — Ambulatory Visit: Payer: BLUE CROSS/BLUE SHIELD | Admitting: Surgery

## 2017-10-10 ENCOUNTER — Ambulatory Visit (HOSPITAL_COMMUNITY)
Admission: RE | Admit: 2017-10-10 | Discharge: 2017-10-10 | Disposition: A | Discharge status: Home / Self Care | Payer: BLUE CROSS/BLUE SHIELD | Source: Ambulatory Visit | Attending: Surgery | Admitting: Surgery

## 2017-10-10 ENCOUNTER — Encounter: Payer: Self-pay | Admitting: Surgery

## 2017-10-10 VITALS — BP 151/79 | HR 79 | Temp 97.7°F | Resp 20 | Ht 71.0 in | Wt 246.0 lb

## 2017-10-10 DIAGNOSIS — I739 Peripheral vascular disease, unspecified: Secondary | ICD-10-CM | POA: Diagnosis not present

## 2017-10-10 DIAGNOSIS — I70213 Atherosclerosis of native arteries of extremities with intermittent claudication, bilateral legs: Secondary | ICD-10-CM | POA: Diagnosis not present

## 2017-10-10 DIAGNOSIS — I70203 Unspecified atherosclerosis of native arteries of extremities, bilateral legs: Secondary | ICD-10-CM | POA: Insufficient documentation

## 2017-10-10 DIAGNOSIS — R0989 Other specified symptoms and signs involving the circulatory and respiratory systems: Secondary | ICD-10-CM | POA: Insufficient documentation

## 2017-10-10 LAB — VAS US LOWER EXTREMITY ARTERIAL DUPLEX
LPOPPPSV: 23 cm/s
LSFMPSV: -66 cm/s
Left popliteal dist sys PSV: 25 cm/s
Left super femoral dist sys PSV: -22 cm/s
Left super femoral prox sys PSV: 74 cm/s
RIGHT ANT DIST TIBAL SYS PSV: -27 cm/s
RPOPPPSV: 45 cm/s
RTIBDISTSYS: 27 cm/s
Right popliteal dist sys PSV: 44 cm/s
Right super femoral dist sys PSV: -46 cm/s
Right super femoral mid sys PSV: -227 cm/s
Right super femoral prox sys PSV: 172 cm/s
left post tibial dist sys: 47 cm/s

## 2017-10-10 NOTE — Progress Notes (Signed)
[facility]  Patient name: Brad Olson MRN: [id number] DOB: [birth year] Sex: male   REQUESTING PROVIDER:    Dr. Purvis Sheffield   REASON FOR CONSULT:    claudication  HISTORY OF PRESENT ILLNESS:   Brad Olson is a 57 y.o. male, who is referred to day for evaluation of claudication.  He states that he had a spider bite to the left leg followed by several boils that ultimately healed however they took a long time.  He does not have any open wounds currently.  He states he can walk approximately 300 feet on a flat surface before he gets leg pain.  The left leg is more severe than the right.  The symptoms are not terribly lifestyle limiting.  The patient has a history of coronary artery disease.  He is status post CABG in 2018.  He has a history of tobacco abuse which is ongoing.  Patient is a diabetic without complications.  He suffers from hypercholesterolemia which is managed with a statin.  He takes an ARB for hypertension.  PAST MEDICAL HISTORY    Past Medical History:  Diagnosis Date  . Diabetes mellitus without complication (HCC)   . Hyperlipidemia   . Obesity   . Tobacco abuse      FAMILY HISTORY   Family History  Problem Relation Age of Onset  . CAD Father        died of massive MI at age 21, first MI several years earlier  . Lung disease Father     SOCIAL HISTORY:   Social History   Socioeconomic History  . Marital status: Married    Spouse name: Not on file  . Number of children: Not on file  . Years of education: Not on file  . Highest education level: Not on file  Social Needs  . Financial resource strain: Not on file  . Food insecurity - worry: Not on file  . Food insecurity - inability: Not on file  . Transportation needs - medical: Not on file  . Transportation needs - non-medical: Not on file  Occupational History  . Not on file  Tobacco Use  . Smoking status: Current Every  Day Smoker    Years: 30.00    Types: Cigarettes    Start date: 07/16/1982  . Smokeless tobacco: Never Used  . Tobacco comment: 8-10 cigarettes per day  Substance and Sexual Activity  . Alcohol use: Yes    Comment: Very rarely  . Drug use: No  . Sexual activity: Not on file  Other Topics Concern  . Not on file  Social History Narrative  . Not on file    ALLERGIES:    Allergies  Allergen Reactions  . Nicotine Rash    Allergic to Nicotine patch.    CURRENT MEDICATIONS:    Current Outpatient Medications  Medication Sig Dispense Refill  . aspirin EC 81 MG tablet Take 1 tablet (81 mg total) by mouth daily. 90 tablet 3  . cholecalciferol (VITAMIN D) 1000 units tablet Take 1,000 Units by mouth daily.    . clopidogrel (PLAVIX) 75 MG tablet Take 1 tablet (75 mg total) by mouth daily. 30 tablet 11  . DiphenhydrAMINE HCl, Sleep, (SLEEP AID) 50 MG CAPS Take 50 mg by mouth at bedtime.    . Ferrous Sulfate (IRON) 325 (65 Fe) MG TABS Take by mouth.    Marland Kitchen glucose blood test strip 1 each by Other route 4 (four) times daily. Use as instructed  4 x daily. E11.65 150 each 5  . Insulin Glargine (TOUJEO MAX SOLOSTAR) 300 UNIT/ML SOPN Inject 80 Units into the skin at bedtime. 9 mL 2  . losartan (COZAAR) 50 MG tablet Take 50 mg by mouth daily.    . Melatonin 10 MG TABS Take 1 tablet by mouth at bedtime.    . metFORMIN (GLUCOPHAGE) 1000 MG tablet TAKE 1 TABLET BY MOUTH TWICE A DAY WITH A MEAL 60 tablet 2  . metoprolol tartrate (LOPRESSOR) 25 MG tablet TAKE 1 TABLET BY MOUTH TWICE A DAY 180 tablet 1  . Multiple Vitamin (MULTIVITAMIN WITH MINERALS) TABS tablet Take 1 tablet by mouth daily.    Marland Kitchen omega-3 acid ethyl esters (LOVAZA) 1 g capsule TAKE 1 CAPSULE (1 G TOTAL) BY MOUTH 2 (TWO) TIMES DAILY. 180 capsule 3  . Probiotic Product (PROBIOTIC-10) CAPS Take by mouth.    Marland Kitchen atorvastatin (LIPITOR) 40 MG tablet Take 1 tablet (40 mg total) by mouth daily. 90 tablet 3   No current facility-administered  medications for this visit.     REVIEW OF SYSTEMS:   [X]  denotes positive finding, [ ]  denotes negative finding Cardiac  Comments:  Chest pain or chest pressure: x   Shortness of breath upon exertion: x   Short of breath when lying flat:    Irregular heart rhythm:        Vascular    Pain in calf, thigh, or hip brought on by ambulation: x   Pain in feet at night that wakes you up from your sleep:  x   Blood clot in your veins:    Leg swelling:         Pulmonary    Oxygen at home:    Productive cough:  x   Wheezing:         Neurologic    Sudden weakness in arms or legs:  x   Sudden numbness in arms or legs:  x   Sudden onset of difficulty speaking or slurred speech:    Temporary loss of vision in one eye:     Problems with dizziness:         Gastrointestinal    Blood in stool:      Vomited blood:         Genitourinary    Burning when urinating:     Blood in urine:        Psychiatric    Major depression:         Hematologic    Bleeding problems:    Problems with blood clotting too easily:        Skin    Rashes or ulcers:        Constitutional    Fever or chills:     PHYSICAL EXAM:   Vitals:   10/10/17 1153  BP: (!) 151/79  Pulse: 79  Resp: 20  Temp: 97.7 F (36.5 C)  TempSrc: Oral  SpO2: 92%  Weight: 246 lb (111.6 kg)  Height: 5\' 11"  (1.803 m)    GENERAL: The patient is a well-nourished male, in no acute distress. The vital signs are documented above. CARDIAC: There is a regular rate and rhythm.  VASCULAR: Palpable femoral pulses, nonpalpable pedal pulses PULMONARY: Nonlabored respirations ABDOMEN: Soft and non-tender.  No pulsatile mass MUSCULOSKELETAL: There are no major deformities or cyanosis. NEUROLOGIC: No focal weakness or paresthesias are detected. SKIN: There are no ulcers or rashes noted. PSYCHIATRIC: The patient has a normal affect.  STUDIES:   I  have reviewed his outside ABIs.  The right is 0.63.  The left is 0.38. Duplex  performed in the office today shows stenosis in the right superficial femoral artery and left superficial femoral artery occlusion  ASSESSMENT and PLAN   Claudication: I do not feel that the patient's symptoms are severe enough to warrant angiography.  I stressed the importance of an exercise program as well as continuing his current medical regimen.  I am not offering him a trial of cilostazol secondary to his ejection fraction and recent CABG.  He understands that I need to know if he develops a nonhealing wound or infection in the leg.  Tobacco abuse: I stressed the importance of smoking cessation.  He was able to quit for several months after CABG but has restarted.  He will follow-up in 1 year for further discussions on how his symptoms are doing with his exercise program, to see if he is able to stop smoking, and to see if there has been any change in his ankle-brachial indices.   Durene Cal, MD [facility] [phone] Pager [phone]

## 2017-11-17 ENCOUNTER — Other Ambulatory Visit: Payer: Self-pay | Admitting: "Endocrinology

## 2017-11-29 ENCOUNTER — Ambulatory Visit: Payer: BLUE CROSS/BLUE SHIELD | Admitting: "Endocrinology

## 2018-01-06 ENCOUNTER — Other Ambulatory Visit: Payer: Self-pay | Admitting: "Endocrinology

## 2018-01-06 DIAGNOSIS — E1165 Type 2 diabetes mellitus with hyperglycemia: Secondary | ICD-10-CM

## 2018-01-11 ENCOUNTER — Other Ambulatory Visit: Payer: Self-pay | Admitting: Cardiovascular Disease

## 2018-01-13 DIAGNOSIS — Z0279 Encounter for issue of other medical certificate: Secondary | ICD-10-CM | POA: Diagnosis not present

## 2018-01-24 ENCOUNTER — Other Ambulatory Visit: Payer: Self-pay | Admitting: "Endocrinology

## 2018-01-26 LAB — COMPREHENSIVE METABOLIC PANEL
AG Ratio: 1.9 (calc) (ref 1.0–2.5)
ALBUMIN MSPROF: 4.7 g/dL (ref 3.6–5.1)
ALT: 31 U/L (ref 9–46)
AST: 28 U/L (ref 10–35)
Alkaline phosphatase (APISO): 56 U/L (ref 40–115)
BILIRUBIN TOTAL: 0.7 mg/dL (ref 0.2–1.2)
BUN: 24 mg/dL (ref 7–25)
CALCIUM: 9.7 mg/dL (ref 8.6–10.3)
CO2: 31 mmol/L (ref 20–32)
Chloride: 101 mmol/L (ref 98–110)
Creat: 0.87 mg/dL (ref 0.70–1.33)
Globulin: 2.5 g/dL (calc) (ref 1.9–3.7)
Glucose, Bld: 220 mg/dL — ABNORMAL HIGH (ref 65–139)
POTASSIUM: 5.3 mmol/L (ref 3.5–5.3)
SODIUM: 138 mmol/L (ref 135–146)
TOTAL PROTEIN: 7.2 g/dL (ref 6.1–8.1)

## 2018-01-26 LAB — HEMOGLOBIN A1C
Hgb A1c MFr Bld: 7.9 % of total Hgb — ABNORMAL HIGH (ref <5.7)
Mean Plasma Glucose: 180 (calc)
eAG (mmol/L): 10 (calc)

## 2018-01-31 ENCOUNTER — Encounter: Payer: Self-pay | Admitting: "Endocrinology

## 2018-01-31 ENCOUNTER — Ambulatory Visit (INDEPENDENT_AMBULATORY_CARE_PROVIDER_SITE_OTHER): Payer: Medicaid - Out of State | Admitting: "Endocrinology

## 2018-01-31 VITALS — BP 133/69 | HR 74 | Ht 71.0 in | Wt 244.0 lb

## 2018-01-31 DIAGNOSIS — E1165 Type 2 diabetes mellitus with hyperglycemia: Secondary | ICD-10-CM

## 2018-01-31 DIAGNOSIS — E1159 Type 2 diabetes mellitus with other circulatory complications: Secondary | ICD-10-CM | POA: Diagnosis not present

## 2018-01-31 DIAGNOSIS — I10 Essential (primary) hypertension: Secondary | ICD-10-CM | POA: Diagnosis not present

## 2018-01-31 DIAGNOSIS — E782 Mixed hyperlipidemia: Secondary | ICD-10-CM | POA: Diagnosis not present

## 2018-01-31 MED ORDER — INSULIN GLARGINE 300 UNIT/ML ~~LOC~~ SOPN: 90.0000 [IU] | PEN_INJECTOR | Freq: Every day | SUBCUTANEOUS | 2 refills | Status: DC

## 2018-01-31 NOTE — Progress Notes (Signed)
Subjective:    Patient ID: Brad Olson, male    DOB: 1961-07-13.  he is being seen in consultation for management of currently uncontrolled symptomatic diabetes requested by  System, Provider Not In.   Past Medical History:  Diagnosis Date  . Diabetes mellitus without complication (HCC)   . Hyperlipidemia   . Obesity   . Tobacco abuse    Past Surgical History:  Procedure Laterality Date  . ABDOMINAL AORTOGRAM N/A 12/22/2016   Procedure: Abdominal Aortogram;  Surgeon: Lennette Bihari, MD;  Location: [facility] INVASIVE CV LAB;  Service: Cardiovascular;  Laterality: N/A;  . COLON RESECTION     due to bowel rupture  . CORONARY ARTERY BYPASS GRAFT N/A 12/22/2016   Procedure: CORONARY ARTERY BYPASS GRAFTING (CABG) x four using left internal mammary artery and right greater saphenous leg vein using endoscope.  Lima-LAD, SVG-PD, SVG-OM, SVG-Diag.;  Surgeon: Delight Ovens, MD;  Location: [facility] OR;  Service: Open Heart Surgery;  Laterality: N/A;  . IABP INSERTION N/A 12/22/2016   Procedure: IABP Insertion;  Surgeon: Lennette Bihari, MD;  Location: [facility] INVASIVE CV LAB;  Service: Cardiovascular;  Laterality: N/A;  . LEFT HEART CATH AND CORONARY ANGIOGRAPHY N/A 12/22/2016   Procedure: Left Heart Cath and Coronary Angiography;  Surgeon: Lennette Bihari, MD;  Location: [facility] INVASIVE CV LAB;  Service: Cardiovascular;  Laterality: N/A;  . TEE WITHOUT CARDIOVERSION N/A 12/22/2016   Procedure: TRANSESOPHAGEAL ECHOCARDIOGRAM (TEE);  Surgeon: Delight Ovens, MD;  Location: [facility] OR;  Service: Open Heart Surgery;  Laterality: N/A;   Social History   Socioeconomic History  . Marital status: Married    Spouse name: Not on file  . Number of children: Not on file  . Years of education: Not on file  . Highest education level: Not on file  Occupational History  . Not on file  Social Needs  . Financial resource strain: Not on file  . Food insecurity:    Worry: Not on file    Inability: Not on file  .  Transportation needs:    Medical: Not on file    Non-medical: Not on file  Tobacco Use  . Smoking status: Current Every Day Smoker    Years: 30.00    Types: Cigarettes    Start date: 07/16/1982  . Smokeless tobacco: Never Used  . Tobacco comment: 8-10 cigarettes per day  Substance and Sexual Activity  . Alcohol use: Yes    Comment: Very rarely  . Drug use: No  . Sexual activity: Not on file  Lifestyle  . Physical activity:    Days per week: Not on file    Minutes per session: Not on file  . Stress: Not on file  Relationships  . Social connections:    Talks on phone: Not on file    Gets together: Not on file    Attends religious service: Not on file    Active member of club or organization: Not on file    Attends meetings of clubs or organizations: Not on file    Relationship status: Not on file  Other Topics Concern  . Not on file  Social History Narrative  . Not on file   Outpatient Encounter Medications as of 01/31/2018  Medication Sig  . aspirin EC 81 MG tablet Take 1 tablet (81 mg total) by mouth daily.  Marland Kitchen atorvastatin (LIPITOR) 40 MG tablet Take 1 tablet (40 mg total) by mouth daily.  . cholecalciferol (VITAMIN D) 1000 units tablet  Take 1,000 Units by mouth daily.  . clopidogrel (PLAVIX) 75 MG tablet Take 1 tablet (75 mg total) by mouth daily.  . DiphenhydrAMINE HCl, Sleep, (SLEEP AID) 50 MG CAPS Take 50 mg by mouth at bedtime.  . Ferrous Sulfate (IRON) 325 (65 Fe) MG TABS Take by mouth.  Marland Kitchen glucose blood test strip 1 each by Other route 4 (four) times daily. Use as instructed 4 x daily. E11.65  . Insulin Glargine (TOUJEO MAX SOLOSTAR) 300 UNIT/ML SOPN Inject 90 Units into the skin at bedtime.  Marland Kitchen losartan (COZAAR) 50 MG tablet Take 50 mg by mouth daily.  . Melatonin 10 MG TABS Take 1 tablet by mouth at bedtime.  . metFORMIN (GLUCOPHAGE) 1000 MG tablet TAKE 1 TABLET BY MOUTH TWICE A DAY WITH A MEAL  . metoprolol tartrate (LOPRESSOR) 25 MG tablet TAKE 1 TABLET BY MOUTH  TWICE A DAY  . Multiple Vitamin (MULTIVITAMIN WITH MINERALS) TABS tablet Take 1 tablet by mouth daily.  Marland Kitchen omega-3 acid ethyl esters (LOVAZA) 1 g capsule TAKE 1 CAPSULE (1 G TOTAL) BY MOUTH 2 (TWO) TIMES DAILY.  . Probiotic Product (PROBIOTIC-10) CAPS Take by mouth.  . [DISCONTINUED] TOUJEO MAX SOLOSTAR 300 UNIT/ML SOPN INJECT 80 UNITS SUBCUTANEOUSLY AT BEDTIME   No facility-administered encounter medications on file as of 01/31/2018.     ALLERGIES: Allergies  Allergen Reactions  . Nicotine Rash    Allergic to Nicotine patch.    VACCINATION STATUS:  There is no immunization history on file for this patient.  Diabetes  He presents for his follow-up diabetic visit. He has type 2 diabetes mellitus. Onset time: He was diagnosed at approximate age of 57 years. His disease course has been worsening. There are no hypoglycemic associated symptoms. Pertinent negatives for hypoglycemia include no confusion, headaches, pallor or seizures. Associated symptoms include foot paresthesias. Pertinent negatives for diabetes include no chest pain, no fatigue, no polydipsia, no polyphagia, no polyuria and no weakness. There are no hypoglycemic complications. Symptoms are worsening. Diabetic complications include heart disease and PVD. (He is status post quadruple bypass of coronary artery disease in April 2018.) Risk factors for coronary artery disease include dyslipidemia, diabetes mellitus, family history, male sex, obesity, hypertension, sedentary lifestyle and tobacco exposure. Current diabetic treatment includes insulin injections and oral agent (monotherapy). His weight is stable. He is following a generally unhealthy diet. When asked about meal planning, he reported none. He has not had a previous visit with a dietitian. He never participates in exercise. Blood glucose monitoring compliance is inadequate. His home blood glucose trend is fluctuating minimally. His breakfast blood glucose range is generally  180-200 mg/dl. His bedtime blood glucose range is generally >200 mg/dl. His overall blood glucose range is 180-200 mg/dl. An ACE inhibitor/angiotensin II receptor blocker is being taken. He sees a podiatrist. Hyperlipidemia  This is a chronic problem. The current episode started more than 1 year ago. The problem is uncontrolled (He has severe hypertriglyceridemia over 444.). Recent lipid tests were reviewed and are high. Exacerbating diseases include diabetes and obesity. Factors aggravating his hyperlipidemia include smoking. Pertinent negatives include no chest pain, myalgias or shortness of breath. Current antihyperlipidemic treatment includes statins and bile acid squestrants. Risk factors for coronary artery disease include diabetes mellitus, dyslipidemia, family history, male sex, obesity, a sedentary lifestyle and hypertension.  Hypertension  This is a chronic problem. The current episode started more than 1 year ago. The problem is uncontrolled. Pertinent negatives include no chest pain, headaches, neck pain, palpitations or  shortness of breath. Past treatments include ACE inhibitors and beta blockers. Hypertensive end-organ damage includes CAD/MI and PVD.     Review of Systems  Constitutional: Negative for chills, fatigue, fever and unexpected weight change.  HENT: Negative for dental problem, mouth sores and trouble swallowing.   Eyes: Negative for visual disturbance.  Respiratory: Negative for cough, choking, chest tightness, shortness of breath and wheezing.   Cardiovascular: Negative for chest pain, palpitations and leg swelling.  Gastrointestinal: Negative for abdominal distention, abdominal pain, constipation, diarrhea, nausea and vomiting.  Endocrine: Negative for polydipsia, polyphagia and polyuria.  Genitourinary: Negative for dysuria, flank pain, hematuria and urgency.  Musculoskeletal: Negative for back pain, gait problem, myalgias and neck pain.  Skin: Negative for pallor, rash  and wound.  Neurological: Negative for seizures, syncope, weakness, numbness and headaches.  Psychiatric/Behavioral: Negative.  Negative for confusion and dysphoric mood.    Objective:    BP 133/69   Pulse 74   Ht 5\' 11"  (1.803 m)   Wt 244 lb (110.7 kg)   BMI 34.03 kg/m   Wt Readings from Last 3 Encounters:  01/31/18 244 lb (110.7 kg)  10/10/17 246 lb (111.6 kg)  08/29/17 242 lb (109.8 kg)     Physical Exam  Constitutional: He is oriented to person, place, and time. He appears well-developed. He is cooperative. No distress.  HENT:  Head: Normocephalic and atraumatic.  Eyes: EOM are normal.  Neck: Normal range of motion. Neck supple. No tracheal deviation present. No thyromegaly present.  Cardiovascular: Normal rate, S1 normal and S2 normal. Exam reveals no gallop.  No murmur heard. Pulses:      Dorsalis pedis pulses are 1+ on the right side, and 1+ on the left side.       Posterior tibial pulses are 1+ on the right side, and 1+ on the left side.  Pulmonary/Chest: Effort normal. No respiratory distress. He has no wheezes.  Abdominal: He exhibits no distension. There is no tenderness. There is no guarding and no CVA tenderness.  Musculoskeletal: He exhibits no edema.       Right shoulder: He exhibits no swelling and no deformity.  Neurological: He is alert and oriented to person, place, and time. He has normal strength and normal reflexes. No cranial nerve deficit or sensory deficit. Gait normal.  Skin: Skin is warm and dry. No rash noted. No cyanosis. Nails show no clubbing.  Psychiatric: His speech is normal. Cognition and memory are normal.  Reluctant affect.    CMP ( most recent) CMP     Component Value Date/Time   NA 138 01/25/2018 1101   K 5.3 01/25/2018 1101   CL 101 01/25/2018 1101   CO2 31 01/25/2018 1101   GLUCOSE 220 (H) 01/25/2018 1101   BUN 24 01/25/2018 1101   CREATININE 0.87 01/25/2018 1101   CALCIUM 9.7 01/25/2018 1101   PROT 7.2 01/25/2018 1101    ALBUMIN 3.4 (L) 12/23/2016 0359   AST 28 01/25/2018 1101   ALT 31 01/25/2018 1101   ALKPHOS 26 (L) 12/23/2016 0359   BILITOT 0.7 01/25/2018 1101   GFRNONAA 91 08/19/2017 1023   GFRAA 105 08/19/2017 1023     Diabetic Labs (most recent): Lab Results  Component Value Date   HGBA1C 7.9 (H) 01/25/2018   HGBA1C 7.4 (H) 08/19/2017   HGBA1C 8.5 (H) 12/21/2016     Lipid Panel ( most recent) Lipid Panel     Component Value Date/Time   CHOL 115 08/19/2017 1023   TRIG  197 (H) 08/19/2017 1023   HDL 36 (L) 08/19/2017 1023   CHOLHDL 3.2 08/19/2017 1023   VLDL UNABLE TO CALCULATE IF TRIGLYCERIDE OVER 400 mg/dL 16/06/9603 5409   LDLCALC 52 08/19/2017 1023      Assessment & Plan:   1. DM type 2 causing vascular disease (HCC)  - Patient has currently uncontrolled symptomatic type 2 DM since  57 years of age. - He came with higher A1c of 7.9% from 7.4%, after generally improving from 8.5%.   - His recent labs reviewed showing normal renal function. -his diabetes is complicated by coronary artery disease status post quadruple bypass graft in April 2018, severe peripheral arterial disease, chronic heavy smoking, obesity/sedentary life and Norvin Chai remains at a high risk for more acute and chronic complications which include CAD, CVA, CKD, retinopathy, and neuropathy. These are all discussed in detail with the patient.  - I have counseled him on diet management and weight loss, by adopting a carbohydrate restricted/protein rich diet.  -  Suggestion is made for him to avoid simple carbohydrates  from his diet including Cakes, Sweet Desserts / Pastries, Ice Cream, Soda (diet and regular), Sweet Tea, Candies, Chips, Cookies, Store Bought Juices, Alcohol in Excess of  1-2 drinks a day, Artificial Sweeteners, and "Sugar-free" Products. This will help patient to have stable blood glucose profile and potentially avoid unintended weight gain.   - I encouraged him to switch to  unprocessed or  minimally processed complex starch and increased protein intake (animal or plant source), fruits, and vegetables.  - he is advised to stick to a routine mealtimes to eat 3 meals  a day and avoid unnecessary snacks ( to snack only to correct hypoglycemia).   - I have approached him with the following individualized plan to manage diabetes and patient agrees:   - He has responded to adjusted basal insulin, average blood glucose 187.  However he is not consistently monitoring and documenting his glucose and insulin activities. -I approached him for better commitment.  In the meantime, I will proceed to increase his Toujeo to 90 units nightly, associated with strict monitoring of blood glucose daily before breakfast, and at any other time as needed.   - Based on his inadequate commitment, he will not be considered for prandial insulin for now.    -Patient is encouraged to call clinic for blood glucose levels less than 70 or above 200 mg /dl. - I will continue  metformin 1000 mg by mouth twice a day, therapeutically suitable for patient. -  He has not been suitable candidate for incretin therapy  due to severe hypertriglyceridemia of 444. His triglycerides are improving to 197, may qualify for weekly  incretin therapy , next visit.  I dvised him to avoid fried food and butter. - Patient specific target  A1c;  LDL, HDL, Triglycerides, and  Waist Circumference were discussed in detail.  2) BP/HTN: His blood pressure is controlled to target.  He is advised to continue current medications including losartan 50 mg p.o. daily.   3) Lipids/HPL:   Uncontrolled, triglycerides 444.   Patient is advised to continue statins and Lovaza. 4)  Weight/Diet: CDE Consult has been initiated , exercise, and detailed carbohydrates information provided.   5) Chronic Care/Health Maintenance:  -he  is on ACEI/ARB and Statin medications and  is encouraged to continue to follow up with Ophthalmology, Dentist,  Podiatrist at  least yearly or according to recommendations, and advised to  quit smoking. I have recommended  yearly flu vaccine and pneumonia vaccination at least every 5 years; moderate intensity exercise for up to 150 minutes weekly; and  sleep for at least 7 hours a day.   - I advised patient to maintain close follow up with his PCP for primary care needs.  - Time spent with the patient: 25 min, of which >50% was spent in reviewing his blood glucose logs , discussing his hypo- and hyper-glycemic episodes, reviewing his current and  previous labs and insulin doses and developing a plan to avoid hypo- and hyper-glycemia. Please refer to Patient Instructions for Blood Glucose Monitoring and Insulin/Medications Dosing Guide"  in media tab for additional information. Brad Olson participated in the discussions, expressed understanding, and voiced agreement with the above plans.  All questions were answered to his satisfaction. he is encouraged to contact clinic should he have any questions or concerns prior to his return visit.  Follow up plan: - Return in about 3 months (around 05/03/2018) for follow up with pre-visit labs, meter, and logs.  Marquis Lunch, MD Phone: [phone]  Fax: [phone]   01/31/2018, 1:44 PM This note was partially dictated with voice recognition software. Similar sounding words can be transcribed inadequately or may not  be corrected upon review.

## 2018-01-31 NOTE — Patient Instructions (Signed)

## 2018-02-23 ENCOUNTER — Other Ambulatory Visit: Payer: Self-pay | Admitting: Adult Health

## 2018-02-23 ENCOUNTER — Other Ambulatory Visit: Payer: Self-pay | Admitting: "Endocrinology

## 2018-02-27 NOTE — Telephone Encounter (Signed)
PT OVERDUE FOR OV PLEASE CALL FOR APPT  

## 2018-02-28 ENCOUNTER — Other Ambulatory Visit: Payer: Self-pay

## 2018-02-28 MED ORDER — INSULIN GLARGINE 300 UNIT/ML ~~LOC~~ SOPN: 90.0000 [IU] | PEN_INJECTOR | Freq: Every day | SUBCUTANEOUS | 2 refills | Status: DC

## 2018-03-02 ENCOUNTER — Other Ambulatory Visit: Payer: Self-pay | Admitting: Cardiovascular Disease

## 2018-03-02 ENCOUNTER — Other Ambulatory Visit: Payer: Self-pay

## 2018-03-02 MED ORDER — INSULIN GLARGINE 100 UNIT/ML SOLOSTAR PEN: 90.0000 [IU] | PEN_INJECTOR | Freq: Every day | SUBCUTANEOUS | 2 refills | Status: DC

## 2018-03-02 MED ORDER — OMEGA-3-ACID ETHYL ESTERS 1 G PO CAPS: 1.0000 g | ORAL_CAPSULE | Freq: Two times a day (BID) | ORAL | 3 refills | Status: DC

## 2018-03-02 MED ORDER — BLOOD GLUCOSE MONITOR KIT: PACK | 5 refills | Status: AC

## 2018-03-02 NOTE — Telephone Encounter (Signed)
Wife advised to have the pharmacy contact our office directly with this request. Verbalized understanding.

## 2018-03-02 NOTE — Telephone Encounter (Signed)
Done, sent to [facility] [place], [place].

## 2018-03-02 NOTE — Telephone Encounter (Signed)
Pt's wife is saying that he's needing a prior auth sent for his omega-3 acid ethyl esters (LOVAZA) 1 g capsule [[id number]]

## 2018-03-02 NOTE — Telephone Encounter (Signed)
Needing refill on omega-3 acid ethyl esters (LOVAZA) 1 g capsule [[id number]]

## 2018-03-06 ENCOUNTER — Telehealth: Payer: Self-pay | Admitting: Cardiovascular Disease

## 2018-03-06 IMAGING — CR DG CHEST 1V PORT
1 series · 1 of 1 positions shown · non-contrast
Comparison: Portable chest x-ray December 22, 2016

CLINICAL DATA: Status post CABG with intra-aortic balloon pump
placement

EXAM:
PORTABLE CHEST 1 VIEW

[AP]
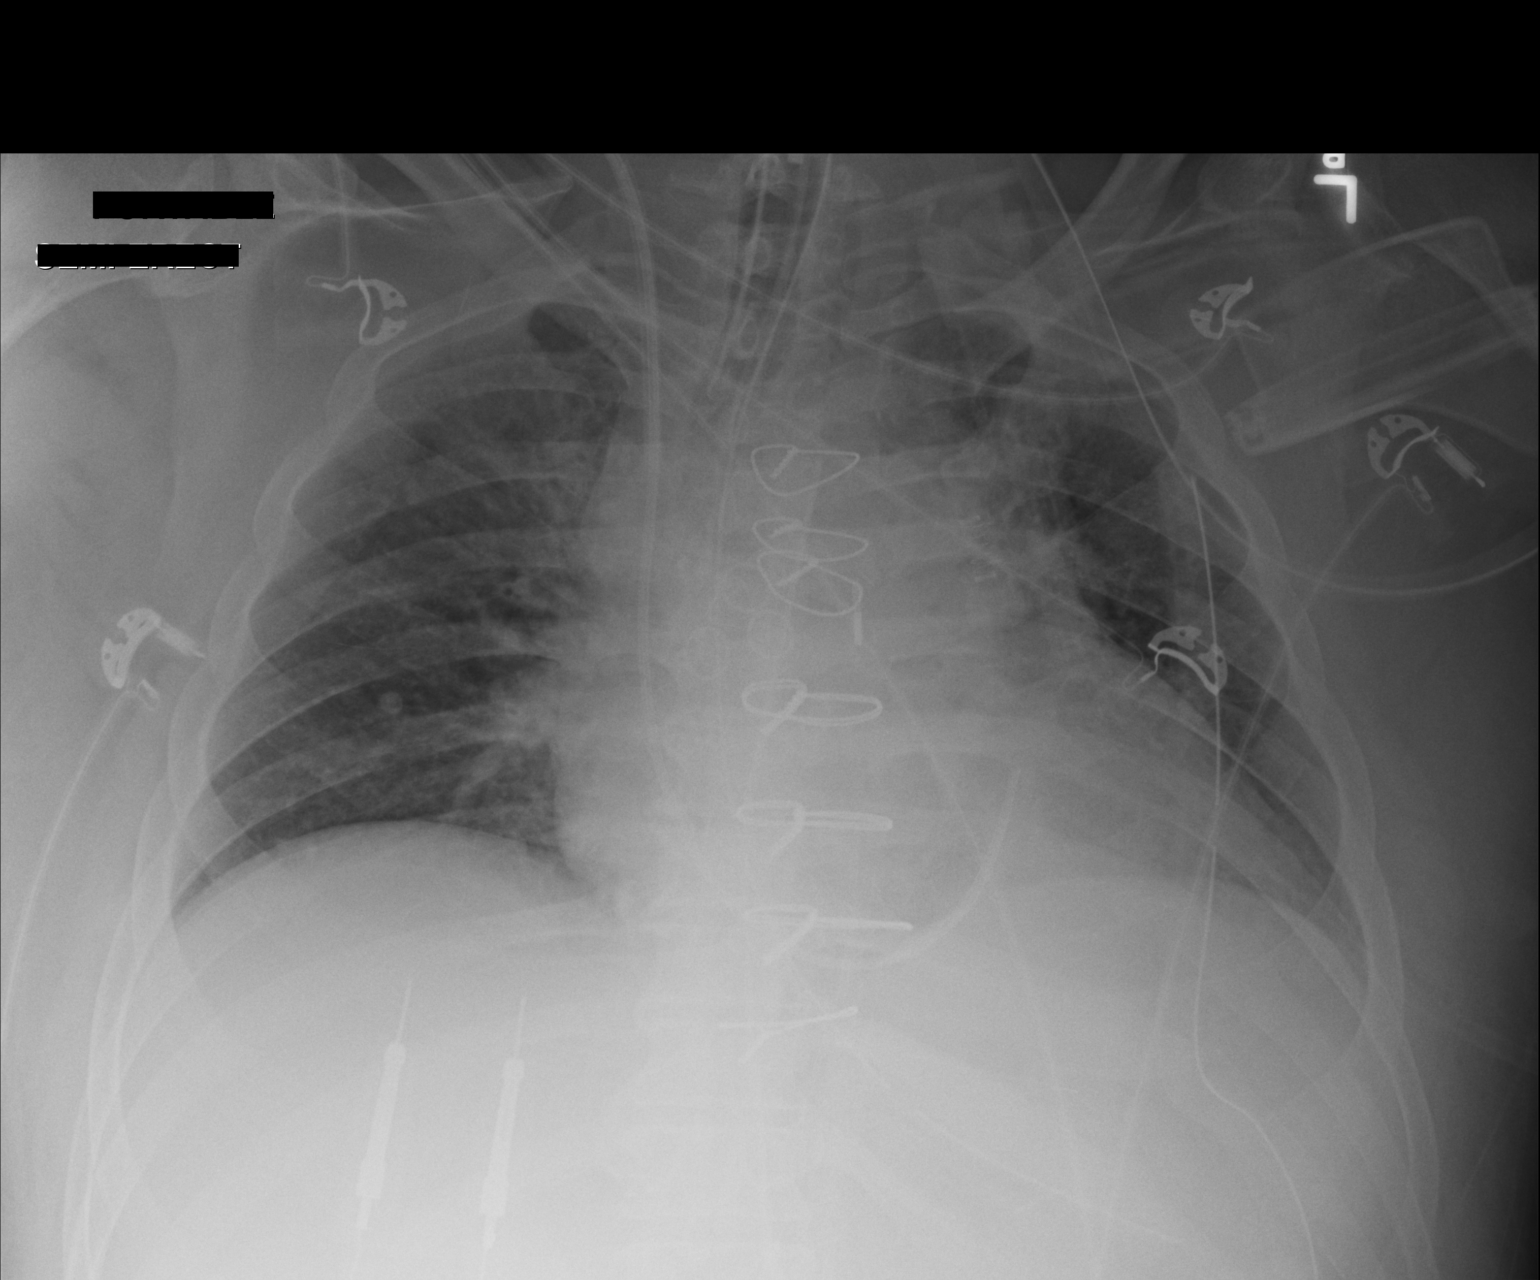

[1 of 1 positions shown; findings below may reference images not displayed]

FINDINGS: The lungs remain mildly hypoinflated. The interstitial markings are
mildly increased. The pulmonary vascularity is engorged. There is no
pneumothorax nor significant pleural effusion. The cardiac
silhouette is enlarged. The sternal wires are intact. The
intra-aortic balloon pump marker appears to be in stable position
just inferior to the aortic arch. The Swan-Ganz catheter tip
projects in the proximal main pulmonary outflow tract. The
endotracheal tube tip lies approximately 3.9 cm above the carina.
The mediastinal drain and the left chest tube are in stable
position.
IMPRESSION: Mild CHF. No pneumothorax, pleural effusion, or pneumonia. The
support tubes are in reasonable position.

## 2018-03-06 NOTE — Telephone Encounter (Signed)
Pt no longer has [facility], went on [place] and they will not cover Lovaza,. They plan to buy OTC Omega -3 fish oil and take 1 Gm BID

## 2018-03-06 NOTE — Telephone Encounter (Signed)
Questions regarding medications / tg

## 2018-03-08 IMAGING — CR DG CHEST 1V PORT
1 series · 1 of 1 positions shown · non-contrast
Comparison: One-view chest x-ray 12/24/2016

CLINICAL DATA: Three days postop CABG.

EXAM:
PORTABLE CHEST 1 VIEW

[AP]
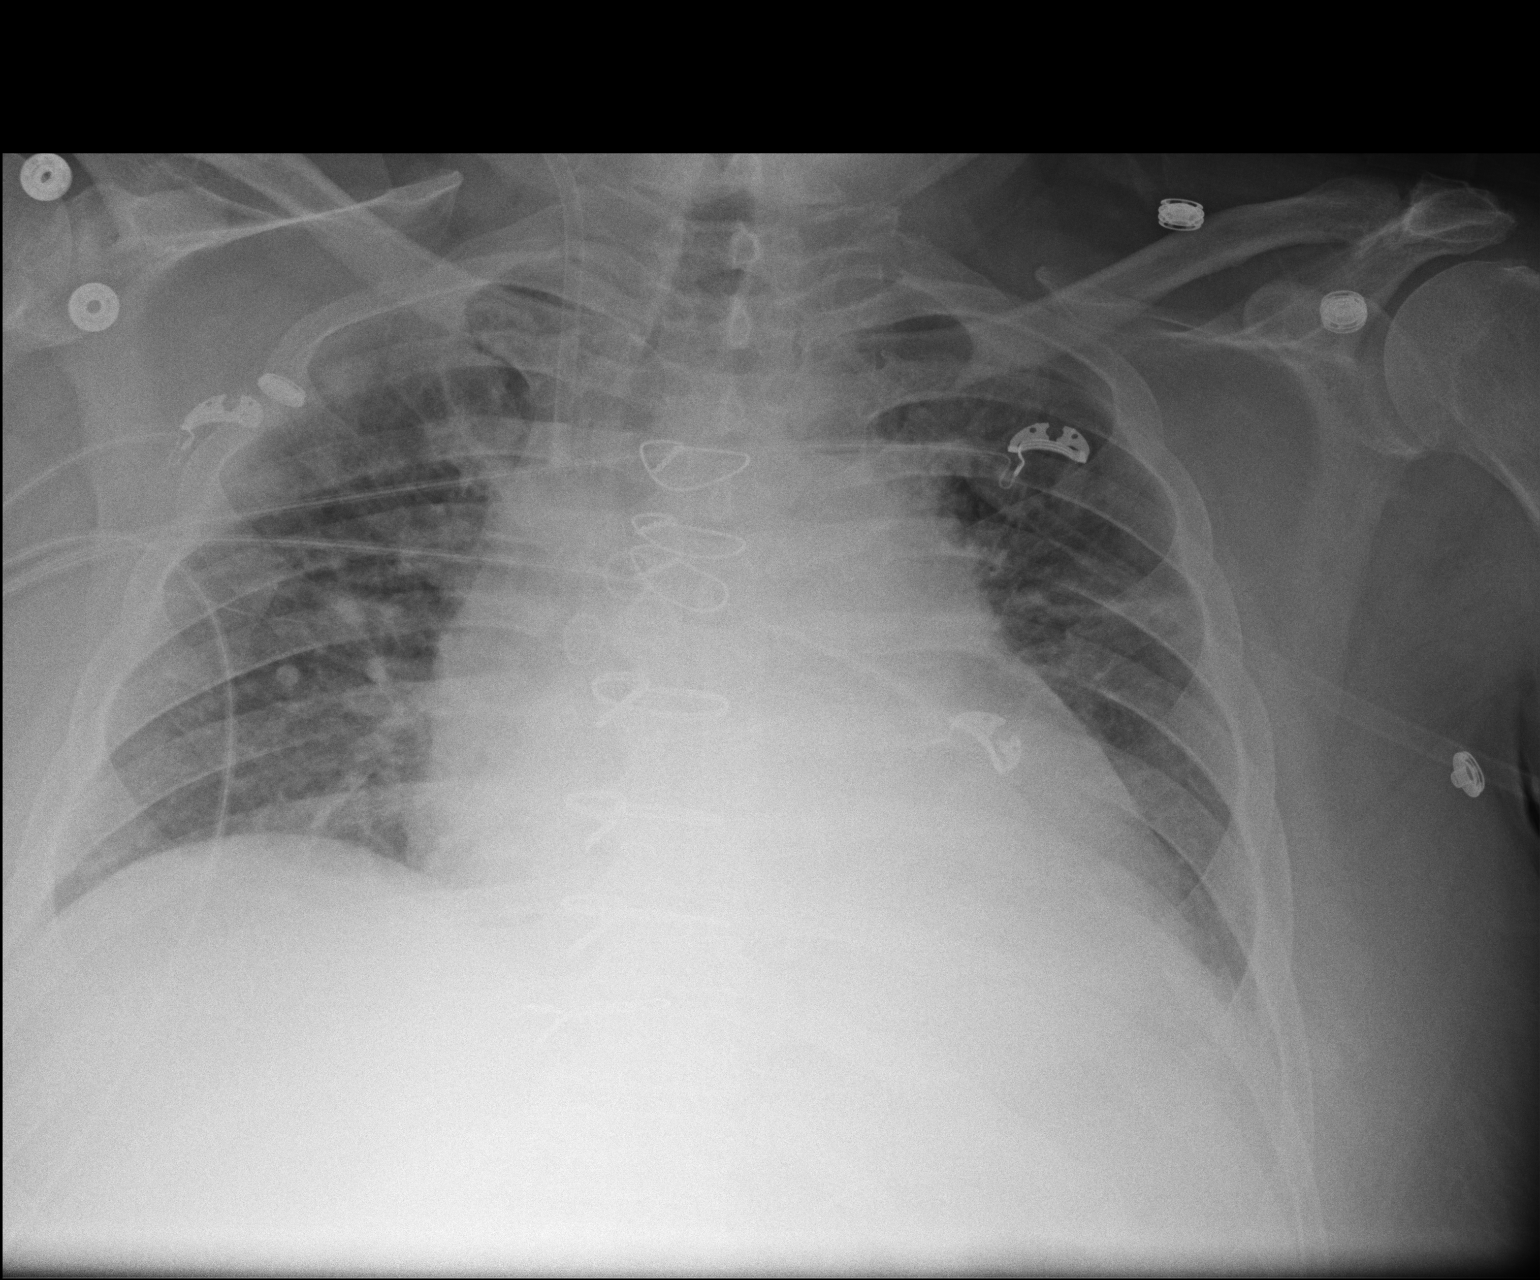

[1 of 1 positions shown; findings below may reference images not displayed]

FINDINGS: The Swan-Ganz catheter has been removed. A right IJ sheath remains.
The left-sided chest tube has been removed. There is no
pneumothorax. The mediastinal drain was removed. Lung volumes remain
low. A small left pleural effusion is suspected. Mild left basilar
atelectasis is noted.
IMPRESSION: 1. Interval removal of right-sided chest tube, mediastinal drain,
and Swan-Ganz catheter.
2. Low lung volumes with small left pleural effusion and associated
atelectasis.
3. No pneumothorax.

## 2018-03-10 IMAGING — CR DG CHEST 1V PORT
1 series · 1 of 1 positions shown · non-contrast
Comparison: Portable chest x-ray December 25, 2016

CLINICAL DATA: Status post CABG five days ago

EXAM:
PORTABLE CHEST 1 VIEW

[AP]
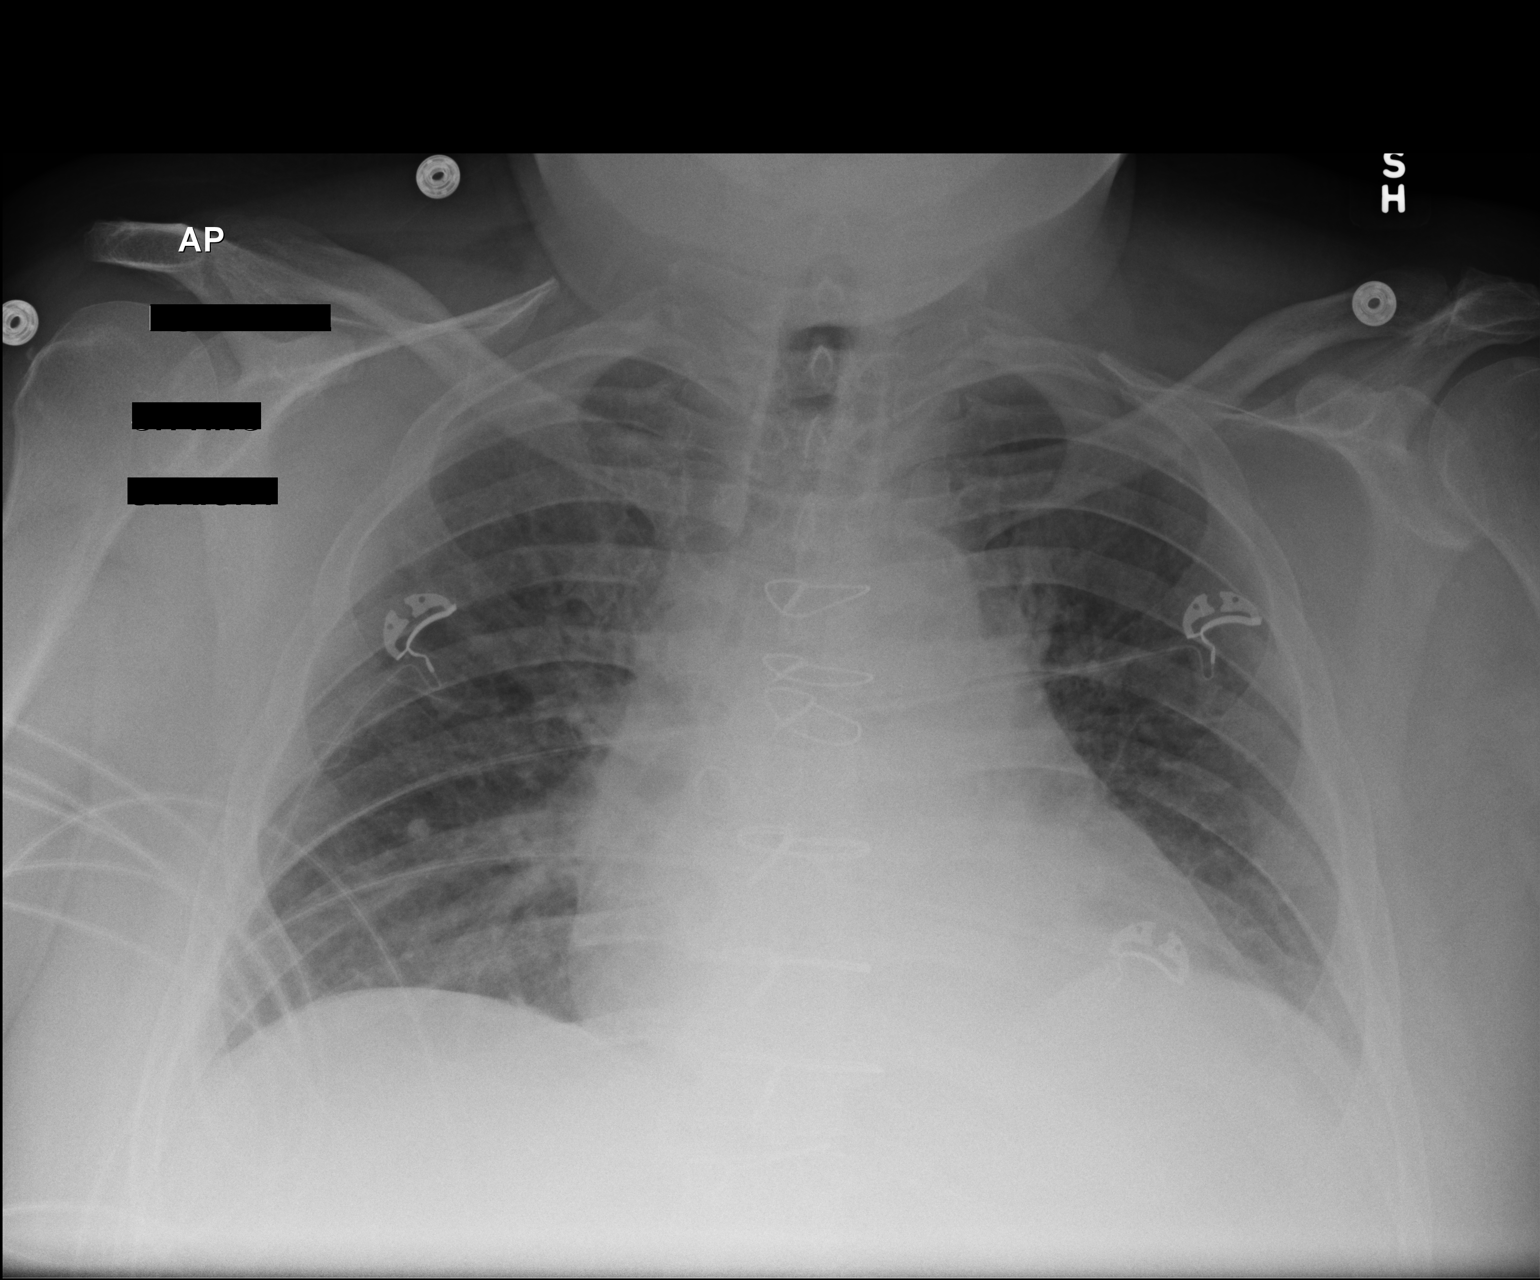

[1 of 1 positions shown; findings below may reference images not displayed]

FINDINGS: The lungs are reasonably well inflated. The pulmonary vascularity is
less engorged. The cardiac silhouette remains enlarged. The left
hemidiaphragm is now sharp. The intra-aortic balloon pump is not
clearly demonstrated on today's study. There is calcification in the
wall of the aortic arch. The sternal wires are intact.
IMPRESSION: Improving CHF. Minimal interstitial edema and mild cardiomegaly
persists. Decreased left lower lobe atelectasis.

## 2018-03-30 ENCOUNTER — Other Ambulatory Visit: Payer: Self-pay | Admitting: Cardiovascular Disease

## 2018-04-07 IMAGING — CR DG CHEST 2V
2 series · 2 of 2 positions shown · non-contrast
Comparison: 12/27/2016

CLINICAL DATA: CABG.  Postoperative follow-up.

EXAM:
CHEST  2 VIEW

[w chest pa]
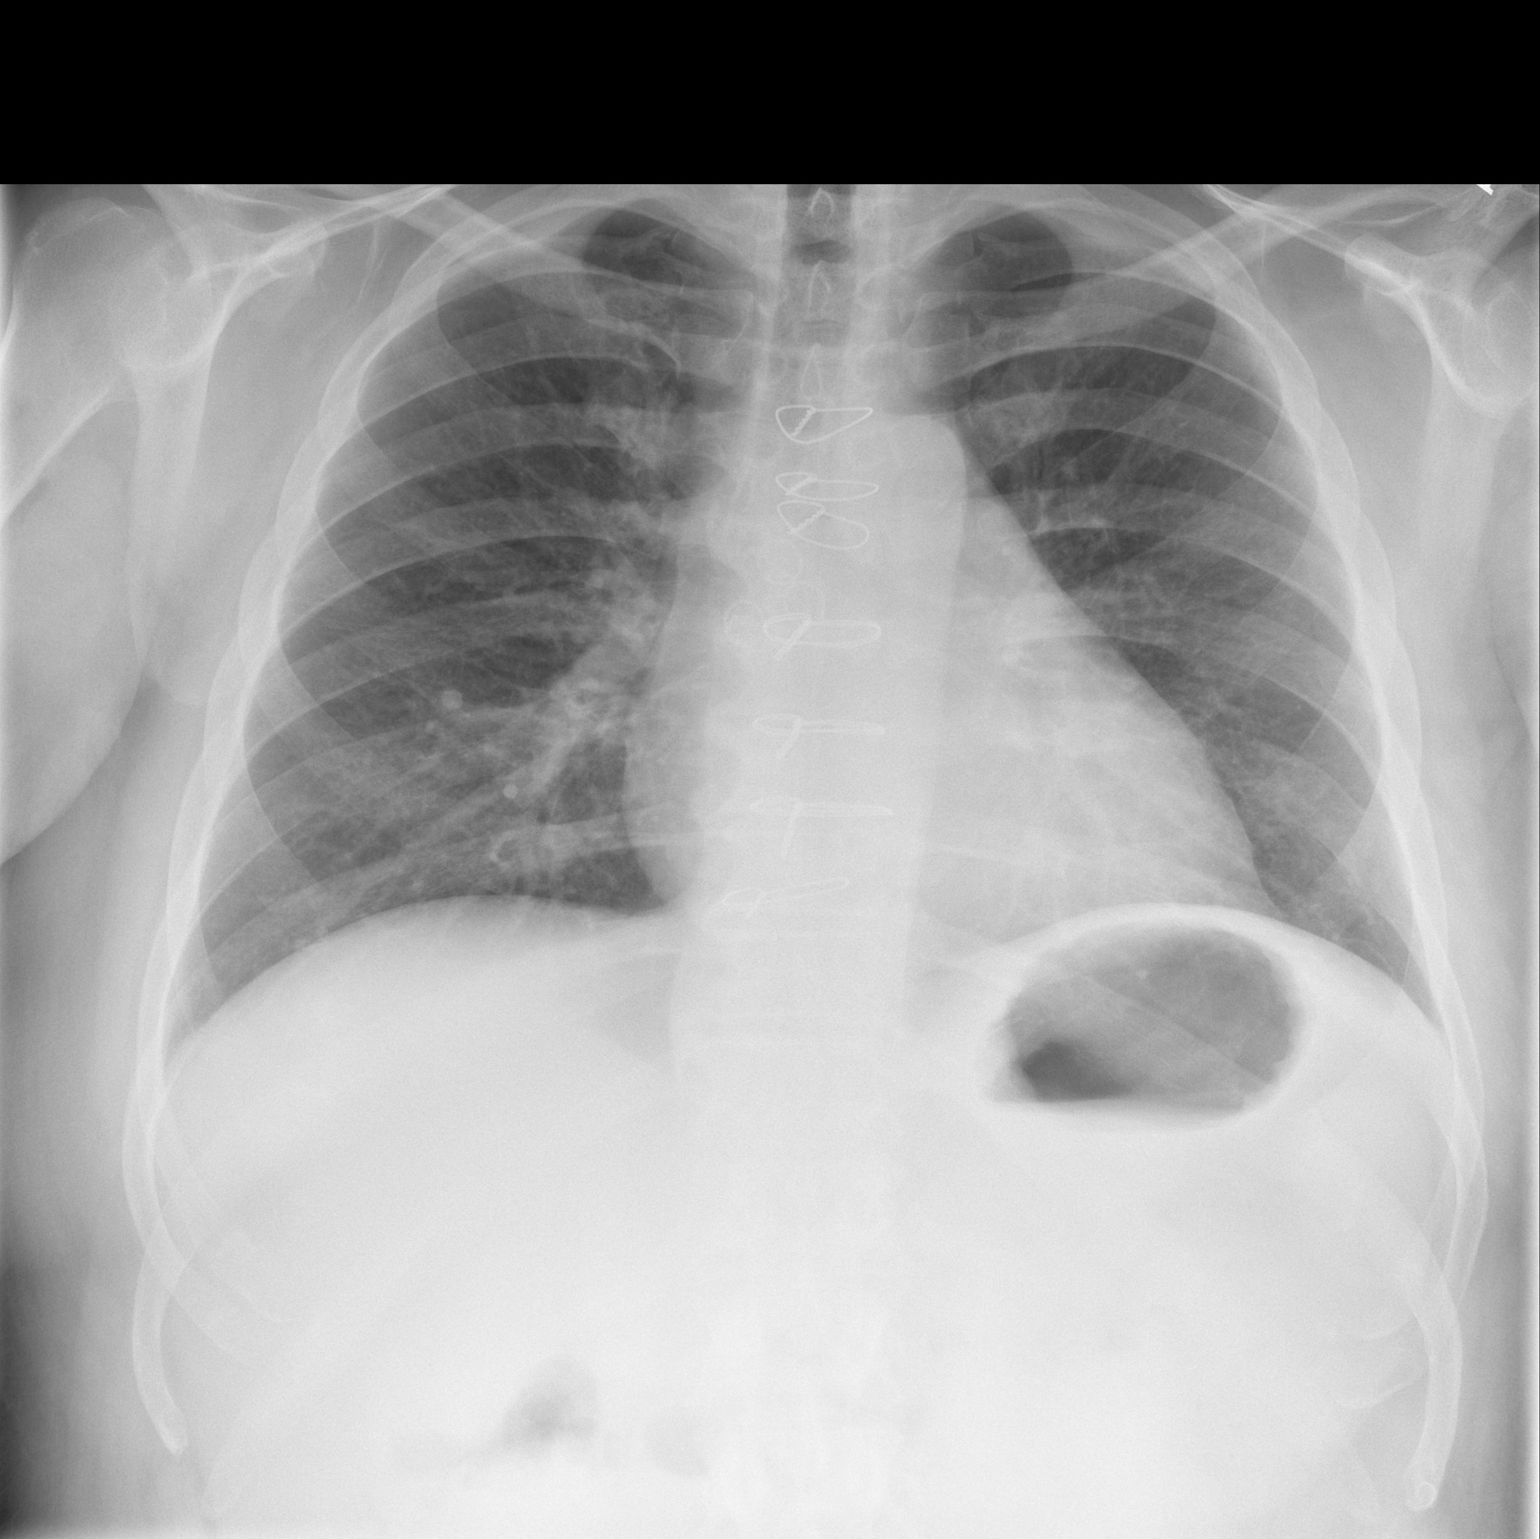

[w chest lat]
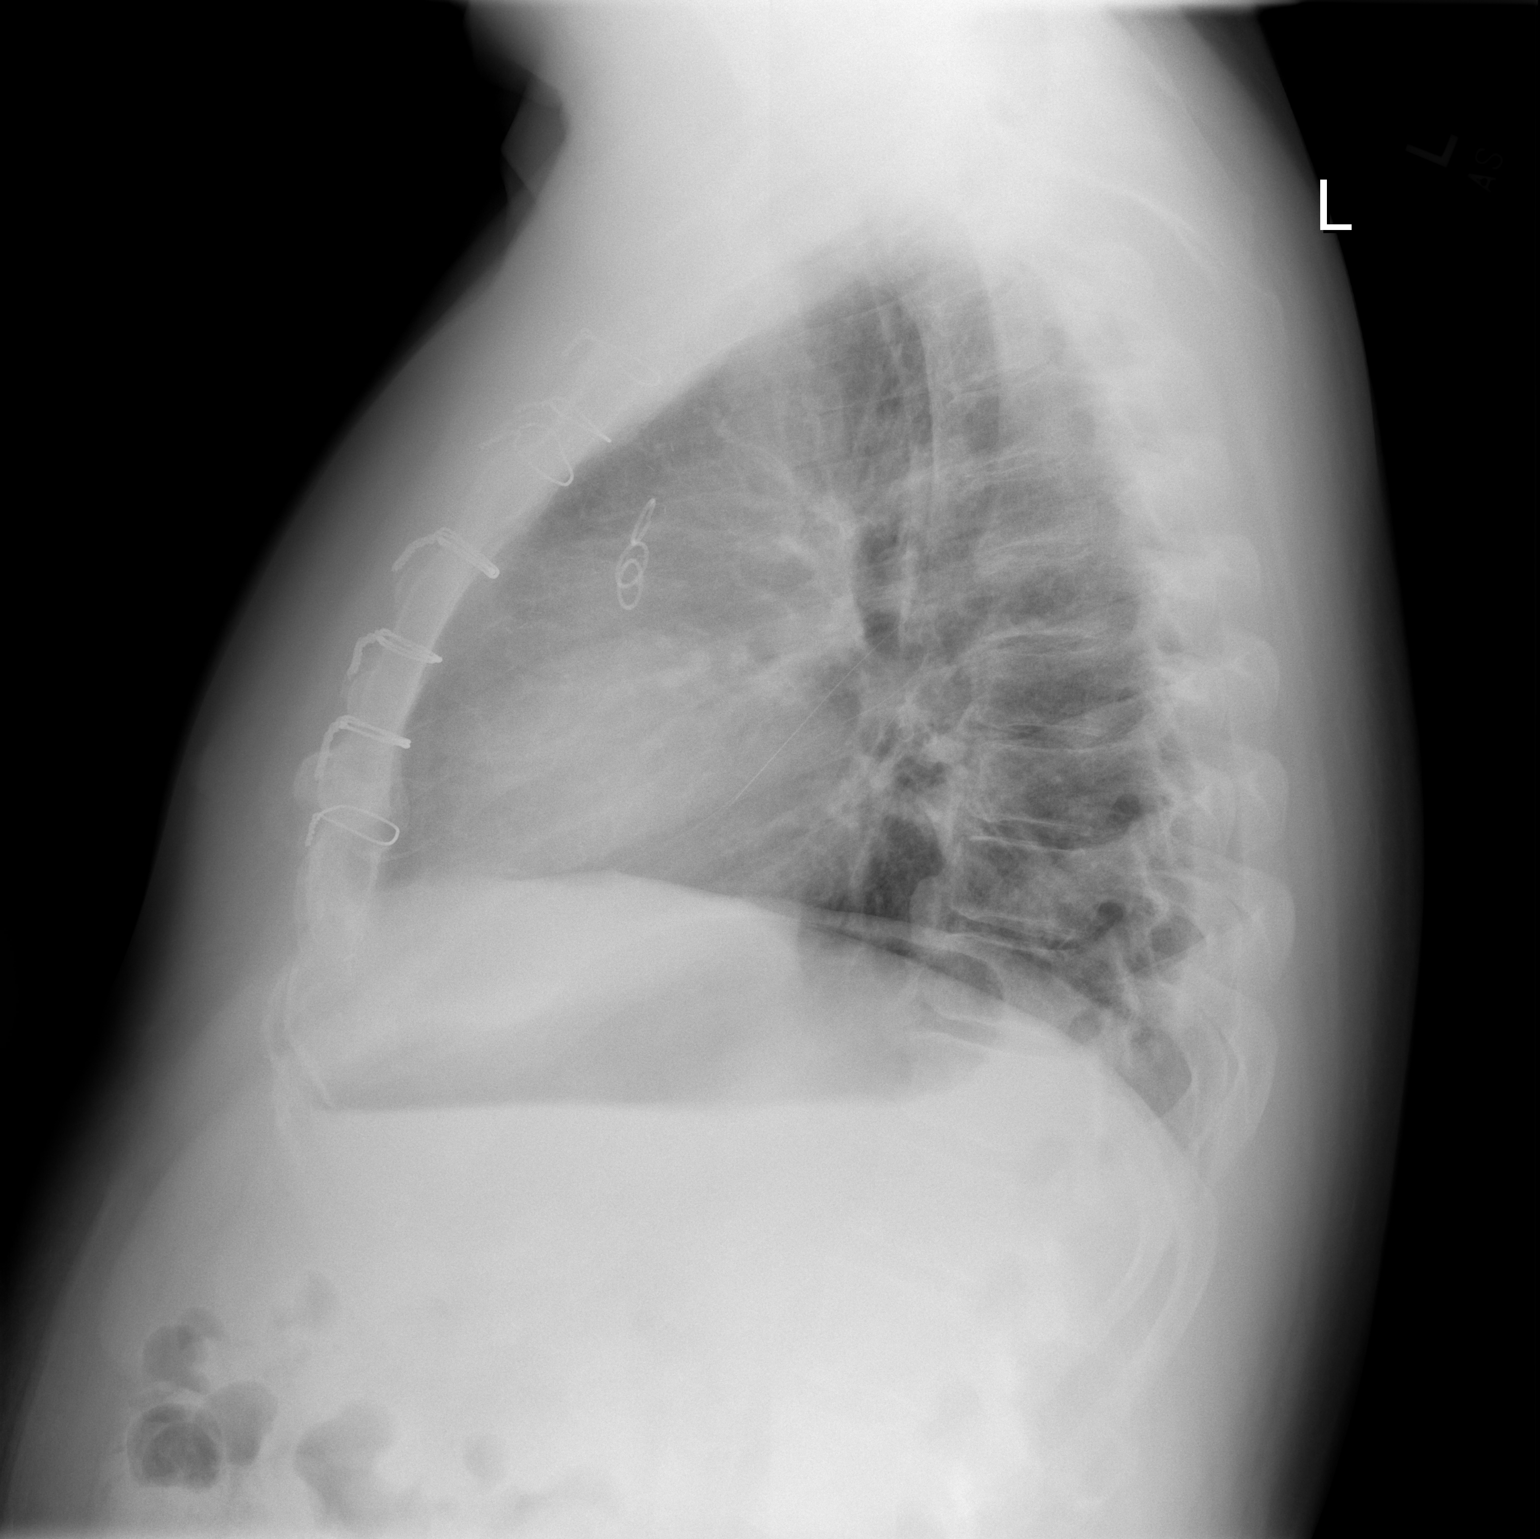

[2 of 2 positions shown; findings below may reference images not displayed]

FINDINGS: Heart size and pulmonary vascularity are normal and the lungs are
clear except for a small granuloma in the right midzone. No
effusions. No pneumothorax. Lungs are clear. CABG.
IMPRESSION: No acute abnormality.  CABG.

## 2018-04-11 ENCOUNTER — Ambulatory Visit (INDEPENDENT_AMBULATORY_CARE_PROVIDER_SITE_OTHER): Payer: 59 | Admitting: Cardiovascular Disease

## 2018-04-11 ENCOUNTER — Encounter: Payer: Self-pay | Admitting: Cardiovascular Disease

## 2018-04-11 VITALS — BP 126/76 | HR 69 | Ht 71.0 in | Wt 246.4 lb

## 2018-04-11 DIAGNOSIS — R0989 Other specified symptoms and signs involving the circulatory and respiratory systems: Secondary | ICD-10-CM

## 2018-04-11 DIAGNOSIS — I739 Peripheral vascular disease, unspecified: Secondary | ICD-10-CM

## 2018-04-11 DIAGNOSIS — F17210 Nicotine dependence, cigarettes, uncomplicated: Secondary | ICD-10-CM | POA: Diagnosis not present

## 2018-04-11 DIAGNOSIS — E1165 Type 2 diabetes mellitus with hyperglycemia: Secondary | ICD-10-CM

## 2018-04-11 DIAGNOSIS — IMO0002 Reserved for concepts with insufficient information to code with codable children: Secondary | ICD-10-CM

## 2018-04-11 DIAGNOSIS — I25708 Atherosclerosis of coronary artery bypass graft(s), unspecified, with other forms of angina pectoris: Secondary | ICD-10-CM

## 2018-04-11 DIAGNOSIS — E785 Hyperlipidemia, unspecified: Secondary | ICD-10-CM

## 2018-04-11 DIAGNOSIS — Z794 Long term (current) use of insulin: Secondary | ICD-10-CM

## 2018-04-11 DIAGNOSIS — E118 Type 2 diabetes mellitus with unspecified complications: Secondary | ICD-10-CM

## 2018-04-11 DIAGNOSIS — Z720 Tobacco use: Secondary | ICD-10-CM

## 2018-04-11 DIAGNOSIS — I10 Essential (primary) hypertension: Secondary | ICD-10-CM | POA: Diagnosis not present

## 2018-04-11 MED ORDER — NITROGLYCERIN 0.4 MG SL SUBL: 0.4000 mg | SUBLINGUAL_TABLET | SUBLINGUAL | 3 refills | Status: DC | PRN

## 2018-04-11 NOTE — Progress Notes (Signed)
SUBJECTIVE: The patient presents for routine follow-up.  He was evaluated by vascular surgery in January 2019.  He did not warrant angiography.  Tobacco cessation was recommended.  He underwent four-vessel CABG on 12/22/2016.  Echocardiogram 08/26/2017: Normal left ventricular systolic and diastolic function with normal regional wall motion, LVEF 60 to 65%.  ECG performed in the office today which I ordered and personally interpreted demonstrates normal sinus rhythm with no ischemic ST segment abnormalities.  There were nonspecific T wave abnormalities in lateral leads.  There were no arrhythmias.  He denies exertional chest pain and dyspnea.  He denies leg swelling.  He seldom has palpitations.  He has occasional "pops and clicks "when turning from one side to the other in bed.  He has been active remodeling a sunroom.  He has also been in the pool quite a bit with his grandson.      Review of Systems: As per "subjective", otherwise negative.  Allergies  Allergen Reactions  . Nicotine Rash    Allergic to Nicotine patch.    Current Outpatient Medications  Medication Sig Dispense Refill  . aspirin EC 81 MG tablet Take 1 tablet (81 mg total) by mouth daily. 90 tablet 3  . atorvastatin (LIPITOR) 40 MG tablet TAKE 1 TABLET BY MOUTH EVERY DAY 30 tablet 11  . blood glucose meter kit and supplies KIT Dispense based on patient and insurance preference. Use up to 3 times daily as directed. (FOR ICD-10 E11.65) 1 each 5  . cholecalciferol (VITAMIN D) 1000 units tablet Take 1,000 Units by mouth daily.    . clopidogrel (PLAVIX) 75 MG tablet TAKE 1 TABLET BY MOUTH EVERY DAY 30 tablet 0  . DiphenhydrAMINE HCl, Sleep, (SLEEP AID) 50 MG CAPS Take 50 mg by mouth at bedtime.    Marland Kitchen glucose blood test strip 1 each by Other route 4 (four) times daily. Use as instructed 4 x daily. E11.65 150 each 5  . Insulin Glargine (LANTUS) 100 UNIT/ML Solostar Pen Inject 90 Units into the skin at bedtime. 30 mL 2   . losartan (COZAAR) 50 MG tablet Take 50 mg by mouth daily.    . Melatonin 10 MG TABS Take 1 tablet by mouth at bedtime.    . metFORMIN (GLUCOPHAGE) 1000 MG tablet TAKE 1 TABLET BY MOUTH TWICE A DAY WITH A MEAL 60 tablet 2  . metoprolol tartrate (LOPRESSOR) 25 MG tablet TAKE 1 TABLET BY MOUTH TWICE A DAY 180 tablet 1  . Multiple Vitamin (MULTIVITAMIN WITH MINERALS) TABS tablet Take 1 tablet by mouth daily.    . Probiotic Product (PROBIOTIC-10) CAPS Take by mouth.     No current facility-administered medications for this visit.     Past Medical History:  Diagnosis Date  . Diabetes mellitus without complication ([place])   . Hyperlipidemia   . Obesity   . Tobacco abuse     Past Surgical History:  Procedure Laterality Date  . ABDOMINAL AORTOGRAM N/A 12/22/2016   Procedure: Abdominal Aortogram;  Surgeon: Troy Sine, MD;  Location: [facility];  Service: Cardiovascular;  Laterality: N/A;  . COLON RESECTION     due to bowel rupture  . CORONARY ARTERY BYPASS GRAFT N/A 12/22/2016   Procedure: CORONARY ARTERY BYPASS GRAFTING (CABG) x four using left internal mammary artery and right greater saphenous leg vein using endoscope.  [place]-LAD, SVG-PD, SVG-OM, SVG-Diag.;  Surgeon: Grace Isaac, MD;  Location: [facility];  Service: Open Heart Surgery;  Laterality: N/A;  .  IABP INSERTION N/A 12/22/2016   Procedure: IABP Insertion;  Surgeon: Troy Sine, MD;  Location: [facility];  Service: Cardiovascular;  Laterality: N/A;  . LEFT HEART CATH AND CORONARY ANGIOGRAPHY N/A 12/22/2016   Procedure: Left Heart Cath and Coronary Angiography;  Surgeon: Troy Sine, MD;  Location: [facility];  Service: Cardiovascular;  Laterality: N/A;  . TEE WITHOUT CARDIOVERSION N/A 12/22/2016   Procedure: TRANSESOPHAGEAL ECHOCARDIOGRAM (TEE);  Surgeon: Grace Isaac, MD;  Location: [facility];  Service: Open Heart Surgery;  Laterality: N/A;    Social History   Socioeconomic History  . Marital  status: Married    Spouse name: Not on file  . Number of children: Not on file  . Years of education: Not on file  . Highest education level: Not on file  Occupational History  . Not on file  Social Needs  . Financial resource strain: Not on file  . Food insecurity:    Worry: Not on file    Inability: Not on file  . Transportation needs:    Medical: Not on file    Non-medical: Not on file  Tobacco Use  . Smoking status: Current Every Day Smoker    Years: 30.00    Types: Cigarettes    Start date: 07/16/1982  . Smokeless tobacco: Never Used  . Tobacco comment: 8-10 cigarettes per day  Substance and Sexual Activity  . Alcohol use: Yes    Comment: Very rarely  . Drug use: No  . Sexual activity: Not on file  Lifestyle  . Physical activity:    Days per week: Not on file    Minutes per session: Not on file  . Stress: Not on file  Relationships  . Social connections:    Talks on phone: Not on file    Gets together: Not on file    Attends religious service: Not on file    Active member of club or organization: Not on file    Attends meetings of clubs or organizations: Not on file    Relationship status: Not on file  . Intimate partner violence:    Fear of current or ex partner: Not on file    Emotionally abused: Not on file    Physically abused: Not on file    Forced sexual activity: Not on file  Other Topics Concern  . Not on file  Social History Narrative  . Not on file     Vitals:   04/11/18 1142  BP: 126/76  Pulse: 69  SpO2: 93%  Weight: 246 lb 6.4 oz (111.8 kg)  Height: 5' 11"  (1.803 m)    Wt Readings from Last 3 Encounters:  04/11/18 246 lb 6.4 oz (111.8 kg)  01/31/18 244 lb (110.7 kg)  10/10/17 246 lb (111.6 kg)     PHYSICAL EXAM General: NAD HEENT: Normal. Neck: No JVD, no thyromegaly. Lungs: Clear to auscultation bilaterally with normal respiratory effort. CV: Regular rate and rhythm, normal S1/S2, no S3/S4, no murmur. No pretibial or periankle  edema.  Right carotid bruit.   Abdomen: Soft, nontender, no distention.  Neurologic: Alert and oriented.  Psych: Normal affect. Skin: Normal. Musculoskeletal: No gross deformities.    ECG: Reviewed above under Subjective   Labs: Lab Results  Component Value Date/Time   K 5.3 01/25/2018 11:01 AM   BUN 24 01/25/2018 11:01 AM   CREATININE 0.87 01/25/2018 11:01 AM   ALT 31 01/25/2018 11:01 AM   TSH 1.21 08/19/2017 10:23 AM  HGB 10.8 (L) 12/29/2016 02:14 AM     Lipids: Lab Results  Component Value Date/Time   LDLCALC 52 08/19/2017 10:23 AM   CHOL 115 08/19/2017 10:23 AM   TRIG 197 (H) 08/19/2017 10:23 AM   HDL 36 (L) 08/19/2017 10:23 AM       ASSESSMENT AND PLAN:  1. Coronary artery disease status post 4 vessel CABG:  Symptomatically stable. Continue ASA, Lipitor, losartan, and metoprolol.  I will discontinue Plavix.  I will provide a prescription for sublingual nitroglycerin.  2. Hypertension: Controlled.  No changes to therapy.  3. Hyperlipidemia: Continue Lipitor 40 mg.   LDL 52 on 08/19/2017.  4. Tobacco abuse: Cessation counseling  again provided (3 minutes).  He used to smoke up to 2 packs of cigarettes daily and is down to about a half a pack of cigarettes daily.  5. Type 2 diabetes mellitus: Managed by endocrinology.  Hemoglobin A1c 7.9% on 01/25/2018.  He is currently taking insulin glargine and metformin.  6.  Peripheral arterial disease: Continue aspirin and statin.  Tobacco cessation counseling previously provided.   7.  Right carotid bruit: Most recent carotid Dopplers were performed in April 2018.  I will repeat Dopplers.  Continue aspirin and Lipitor.   Disposition: Follow up 6 months   Kate Sable, M.D., F.A.C.C.

## 2018-04-11 NOTE — Patient Instructions (Addendum)
Your physician wants you to follow-up in:  6 months with Dr.Koneswaran You will receive a reminder letter in the mail two months in advance. If you don't receive a letter, please call our office to schedule the follow-up appointment.    Your physician has requested that you have a carotid duplex. This test is an ultrasound of the carotid arteries in your neck. It looks at blood flow through these arteries that supply the brain with blood. Allow one hour for this exam. There are no restrictions or special instructions.    STOP Plavix     Take nitroglycerine as directed    No labs today     Thank you for choosing [facility] !       Nitroglycerin sublingual tablets What is this medicine? NITROGLYCERIN (nye troe GLI ser in) is a type of vasodilator. It relaxes blood vessels, increasing the blood and oxygen supply to your heart. This medicine is used to relieve chest pain caused by angina. It is also used to prevent chest pain before activities like climbing stairs, going outdoors in cold weather, or sexual activity. This medicine may be used for other purposes; ask your health care provider or pharmacist if you have questions. COMMON BRAND NAME(S): Nitroquick, Nitrostat, Nitrotab What should I tell my health care provider before I take this medicine? They need to know if you have any of these conditions: -anemia -head injury, recent stroke, or bleeding in the brain -liver disease -previous heart attack -an unusual or allergic reaction to nitroglycerin, other medicines, foods, dyes, or preservatives -pregnant or trying to get pregnant -breast-feeding How should I use this medicine? Take this medicine by mouth as needed. At the first sign of an angina attack (chest pain or tightness) place one tablet under your tongue. You can also take this medicine 5 to 10 minutes before an event likely to produce chest pain. Follow the directions on the prescription  label. Let the tablet dissolve under the tongue. Do not swallow whole. Replace the dose if you accidentally swallow it. It will help if your mouth is not dry. Saliva around the tablet will help it to dissolve more quickly. Do not eat or drink, smoke or chew tobacco while a tablet is dissolving. If you are not better within 5 minutes after taking ONE dose of nitroglycerin, call 9-1-1 immediately to seek emergency medical care. Do not take more than 3 nitroglycerin tablets over 15 minutes. If you take this medicine often to relieve symptoms of angina, your doctor or health care professional may provide you with different instructions to manage your symptoms. If symptoms do not go away after following these instructions, it is important to call 9-1-1 immediately. Do not take more than 3 nitroglycerin tablets over 15 minutes. Talk to your pediatrician regarding the use of this medicine in children. Special care may be needed. Overdosage: If you think you have taken too much of this medicine contact a poison control center or emergency room at once. NOTE: This medicine is only for you. Do not share this medicine with others. What if I miss a dose? This does not apply. This medicine is only used as needed. What may interact with this medicine? Do not take this medicine with any of the following medications: -certain migraine medicines like ergotamine and dihydroergotamine (DHE) -medicines used to treat erectile dysfunction like sildenafil, tadalafil, and vardenafil -riociguat This medicine may also interact with the following medications: -alteplase -aspirin -heparin -medicines for high blood pressure -  medicines for mental depression -other medicines used to treat angina -phenothiazines like chlorpromazine, mesoridazine, prochlorperazine, thioridazine This list may not describe all possible interactions. Give your health care provider a list of all the medicines, herbs, non-prescription drugs, or  dietary supplements you use. Also tell them if you smoke, drink alcohol, or use illegal drugs. Some items may interact with your medicine. What should I watch for while using this medicine? Tell your doctor or health care professional if you feel your medicine is no longer working. Keep this medicine with you at all times. Sit or lie down when you take your medicine to prevent falling if you feel dizzy or faint after using it. Try to remain calm. This will help you to feel better faster. If you feel dizzy, take several deep breaths and lie down with your feet propped up, or bend forward with your head resting between your knees. You may get drowsy or dizzy. Do not drive, use machinery, or do anything that needs mental alertness until you know how this drug affects you. Do not stand or sit up quickly, especially if you are an older patient. This reduces the risk of dizzy or fainting spells. Alcohol can make you more drowsy and dizzy. Avoid alcoholic drinks. Do not treat yourself for coughs, colds, or pain while you are taking this medicine without asking your doctor or health care professional for advice. Some ingredients may increase your blood pressure. What side effects may I notice from receiving this medicine? Side effects that you should report to your doctor or health care professional as soon as possible: -blurred vision -dry mouth -skin rash -sweating -the feeling of extreme pressure in the head -unusually weak or tired Side effects that usually do not require medical attention (report to your doctor or health care professional if they continue or are bothersome): -flushing of the face or neck -headache -irregular heartbeat, palpitations -nausea, vomiting This list may not describe all possible side effects. Call your doctor for medical advice about side effects. You may report side effects to FDA at 1-800-FDA-1088. Where should I keep my medicine? Keep out of the reach of  children. Store at room temperature between 20 and 25 degrees C (68 and 77 degrees F). Store in Retail buyer. Protect from light and moisture. Keep tightly closed. Throw away any unused medicine after the expiration date. NOTE: This sheet is a summary. It may not cover all possible information. If you have questions about this medicine, talk to your doctor, pharmacist, or health care provider.  2018 Elsevier/Gold Standard (2013-06-28 17:57:36)

## 2018-04-14 ENCOUNTER — Ambulatory Visit (HOSPITAL_COMMUNITY)
Admission: RE | Admit: 2018-04-14 | Discharge: 2018-04-14 | Disposition: A | Discharge status: Home / Self Care | Payer: Medicaid - Out of State | Source: Ambulatory Visit | Attending: Cardiovascular Disease | Admitting: Cardiovascular Disease

## 2018-04-14 ENCOUNTER — Telehealth: Payer: Self-pay

## 2018-04-14 DIAGNOSIS — I6523 Occlusion and stenosis of bilateral carotid arteries: Secondary | ICD-10-CM | POA: Insufficient documentation

## 2018-04-14 DIAGNOSIS — I6521 Occlusion and stenosis of right carotid artery: Secondary | ICD-10-CM

## 2018-04-14 DIAGNOSIS — R0989 Other specified symptoms and signs involving the circulatory and respiratory systems: Secondary | ICD-10-CM

## 2018-04-14 NOTE — Telephone Encounter (Signed)
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 He needs to see vascular surgery ASAP. Please make an appointment for him.     Apt soonest available is with Dr Arbie Cookey on 8/12 at 245 pm in the [place] office at [address] office    OK to leave message on home phone, I gave patient info for appt and ask that they call back to discuss

## 2018-04-17 NOTE — Telephone Encounter (Signed)
I spoke with wife, discussed results with her, gave her apt date and time for [facility] office for [facility], she awaits mailed packet from them

## 2018-04-24 ENCOUNTER — Encounter: Payer: PRIVATE HEALTH INSURANCE | Admitting: Vascular Surgery

## 2018-04-24 ENCOUNTER — Telehealth: Payer: Self-pay

## 2018-04-24 DIAGNOSIS — I6523 Occlusion and stenosis of bilateral carotid arteries: Secondary | ICD-10-CM

## 2018-04-24 NOTE — Telephone Encounter (Addendum)
Informed by Dr Bosie Helper staff that patient has Medicaid [place] and they have cancelled pt's apt with them and recommend that patient go to [facility] in [place], as they do not take out of state Medicare   I have placed call for [facility] office manager to call me back.Revonda Standard returned my call and said patients [place] medicaid is now linked with [facility] and we are out of network.I referred patient (per Dr.Koneswaran) to [facility], Dr,Lamiere Joycelyn Rua, phone [phone]    I spoke with teresa at [facility] and I faxed patient's referral, med list, demographics

## 2018-05-03 ENCOUNTER — Ambulatory Visit: Payer: Medicaid - Out of State | Admitting: "Endocrinology

## 2018-05-31 ENCOUNTER — Other Ambulatory Visit: Payer: Self-pay | Admitting: "Endocrinology

## 2018-06-27 ENCOUNTER — Other Ambulatory Visit: Payer: Self-pay | Admitting: "Endocrinology

## 2018-06-27 ENCOUNTER — Telehealth: Payer: Self-pay | Admitting: *Deleted

## 2018-06-27 NOTE — Telephone Encounter (Signed)
He can proceed.

## 2018-06-27 NOTE — Telephone Encounter (Signed)
Dr. Iran Sizer from [facility] called requesting medical clearance, recent echo, stress test and EKG be faxed to [phone]. She can be reached at [phone]. Please advise.

## 2018-06-27 NOTE — Telephone Encounter (Signed)
Clearance for what?

## 2018-06-27 NOTE — Telephone Encounter (Signed)
All requested information faxed to [phone]

## 2018-06-27 NOTE — Telephone Encounter (Signed)
Right carotid angioplasty with or without stents

## 2018-06-28 ENCOUNTER — Telehealth: Payer: Self-pay | Admitting: Cardiovascular Disease

## 2018-06-28 NOTE — Telephone Encounter (Signed)
Returned call to [place]. Spoke with Elnita Maxwell NP also. She stated to hold off on that stress test request at this time.

## 2018-06-28 NOTE — Telephone Encounter (Signed)
Per phone call from Woodward Ku [phone] at [facility]--   Elnita Maxwell NP (tele # [phone]) is wanting pt to have a stress test done for surgical clearance.   Please call Darel Hong or [address]

## 2018-08-04 ENCOUNTER — Other Ambulatory Visit: Payer: Self-pay | Admitting: Cardiovascular Disease

## 2018-10-09 ENCOUNTER — Other Ambulatory Visit: Payer: Self-pay | Admitting: "Endocrinology

## 2018-10-12 ENCOUNTER — Other Ambulatory Visit: Payer: Self-pay | Admitting: "Endocrinology

## 2018-10-17 ENCOUNTER — Other Ambulatory Visit: Payer: Self-pay | Admitting: "Endocrinology

## 2019-08-17 ENCOUNTER — Other Ambulatory Visit: Payer: Self-pay | Admitting: Cardiovascular Disease

## 2019-10-11 ENCOUNTER — Telehealth: Payer: Self-pay | Admitting: Cardiovascular Disease

## 2019-10-11 NOTE — Telephone Encounter (Signed)
Pt is no longer being seen in [place] due to ins not paying, he now has cardiologists in [place] per phone call from pt's wife

## 2023-03-22 ENCOUNTER — Other Ambulatory Visit: Payer: Self-pay

## 2023-03-22 ENCOUNTER — Emergency Department (HOSPITAL_COMMUNITY): Payer: Medicare (Managed Care)

## 2023-03-22 ENCOUNTER — Observation Stay (HOSPITAL_COMMUNITY)
Admission: EM | Admit: 2023-03-22 | Discharge: 2023-03-23 | Disposition: A | Discharge status: Home / Self Care | Payer: Medicare (Managed Care) | Attending: Internal Medicine | Admitting: Internal Medicine

## 2023-03-22 DIAGNOSIS — Z7902 Long term (current) use of antithrombotics/antiplatelets: Secondary | ICD-10-CM | POA: Insufficient documentation

## 2023-03-22 DIAGNOSIS — R0789 Other chest pain: Secondary | ICD-10-CM | POA: Diagnosis present

## 2023-03-22 DIAGNOSIS — Z79899 Other long term (current) drug therapy: Secondary | ICD-10-CM | POA: Insufficient documentation

## 2023-03-22 DIAGNOSIS — F17210 Nicotine dependence, cigarettes, uncomplicated: Secondary | ICD-10-CM | POA: Insufficient documentation

## 2023-03-22 DIAGNOSIS — N179 Acute kidney failure, unspecified: Secondary | ICD-10-CM | POA: Insufficient documentation

## 2023-03-22 DIAGNOSIS — Z951 Presence of aortocoronary bypass graft: Secondary | ICD-10-CM | POA: Diagnosis not present

## 2023-03-22 DIAGNOSIS — Z7984 Long term (current) use of oral hypoglycemic drugs: Secondary | ICD-10-CM | POA: Insufficient documentation

## 2023-03-22 DIAGNOSIS — Z955 Presence of coronary angioplasty implant and graft: Secondary | ICD-10-CM | POA: Insufficient documentation

## 2023-03-22 DIAGNOSIS — E1165 Type 2 diabetes mellitus with hyperglycemia: Secondary | ICD-10-CM | POA: Diagnosis not present

## 2023-03-22 DIAGNOSIS — R079 Chest pain, unspecified: Secondary | ICD-10-CM | POA: Diagnosis present

## 2023-03-22 DIAGNOSIS — E1159 Type 2 diabetes mellitus with other circulatory complications: Secondary | ICD-10-CM | POA: Diagnosis present

## 2023-03-22 DIAGNOSIS — I10 Essential (primary) hypertension: Secondary | ICD-10-CM | POA: Insufficient documentation

## 2023-03-22 DIAGNOSIS — I2510 Atherosclerotic heart disease of native coronary artery without angina pectoris: Secondary | ICD-10-CM | POA: Diagnosis not present

## 2023-03-22 DIAGNOSIS — E782 Mixed hyperlipidemia: Secondary | ICD-10-CM | POA: Diagnosis present

## 2023-03-22 DIAGNOSIS — F17200 Nicotine dependence, unspecified, uncomplicated: Secondary | ICD-10-CM | POA: Diagnosis present

## 2023-03-22 DIAGNOSIS — Z7982 Long term (current) use of aspirin: Secondary | ICD-10-CM | POA: Diagnosis not present

## 2023-03-22 HISTORY — DX: Type 2 diabetes mellitus without complications: E11.9

## 2023-03-22 HISTORY — DX: Atherosclerotic heart disease of native coronary artery without angina pectoris: I25.10

## 2023-03-22 LAB — CBC
HCT: 35.3 % — ABNORMAL LOW (ref 39.0–52.0)
Hemoglobin: 12 g/dL — ABNORMAL LOW (ref 13.0–17.0)
MCH: 32.1 pg (ref 26.0–34.0)
MCHC: 34 g/dL (ref 30.0–36.0)
MCV: 94.4 fL (ref 80.0–100.0)
Platelets: 111 10*3/uL — ABNORMAL LOW (ref 150–400)
RBC: 3.74 MIL/uL — ABNORMAL LOW (ref 4.22–5.81)
RDW: 13.3 % (ref 11.5–15.5)
WBC: 6.7 10*3/uL (ref 4.0–10.5)
nRBC: 0 % (ref 0.0–0.2)

## 2023-03-22 LAB — BASIC METABOLIC PANEL
Anion gap: 11 (ref 5–15)
BUN: 35 mg/dL — ABNORMAL HIGH (ref 6–20)
CO2: 25 mmol/L (ref 22–32)
Calcium: 9 mg/dL (ref 8.9–10.3)
Chloride: 102 mmol/L (ref 98–111)
Creatinine, Ser: 1.55 mg/dL — ABNORMAL HIGH (ref 0.61–1.24)
GFR, Estimated: 51 mL/min — ABNORMAL LOW (ref >60)
Glucose, Bld: 146 mg/dL — ABNORMAL HIGH (ref 70–99)
Potassium: 4.5 mmol/L (ref 3.5–5.1)
Sodium: 138 mmol/L (ref 135–145)

## 2023-03-22 LAB — TROPONIN I (HIGH SENSITIVITY)
Troponin I (High Sensitivity): 5 ng/L (ref <18)
Troponin I (High Sensitivity): 6 ng/L (ref <18)

## 2023-03-22 MED ORDER — LACTATED RINGERS IV BOLUS
1000.0000 mL | Freq: Once | INTRAVENOUS | Status: AC
  Administered 2023-03-22: 1000 mL via INTRAVENOUS

## 2023-03-22 NOTE — ED Triage Notes (Signed)
Pt states he started having left sided chest pain while pulling a riding lawnmower around 4:45pm today.  Reports it feels like his previous heart attacks.  Reports taking 3 nitroglycerin tabs prior to arrival.  Denies N/V/D, reports SOB, dizziness & weakness.  Describes pain as sharp & pulling at onset & now is dull, pressure, pulling.

## 2023-03-22 NOTE — ED Provider Notes (Signed)
[place] EMERGENCY DEPARTMENT AT [facility] Provider Note   CSN: [id number] Arrival date & time: 03/22/23  1926     History  Chief Complaint  Patient presents with   Chest Pain    Brad Olson is a 62 y.o. male.  Past medical history of four-vessel bypass in 2018, bilateral carotid artery stents presents the ER for chest pain that started several hours ago described as sharp occurred while he was outside and trying to push a tractor across his yard because the wheel had come off.  He did not have any syncope or dizziness but is having sharp pain, pain did not radiate.  States it is initially fairly severe but is now a mild dull pain.  He took 3 nitroglycerin without relief and try to go to the ER in [place] where he lives.  Unfortunately there on security lockdown refused to go in without his wife being allowed and as well so they decided to drive here instead.   Chest Pain      Home Medications Prior to Admission medications   Medication Sig Start Date End Date Taking? Authorizing Provider  aspirin EC 81 MG tablet Take 1 tablet (81 mg total) by mouth daily. 04/22/17   Laqueta Linden, MD  atorvastatin (LIPITOR) 40 MG tablet TAKE 1 TABLET BY MOUTH EVERY DAY 03/30/18   Laqueta Linden, MD  blood glucose meter kit and supplies KIT Dispense based on patient and insurance preference. Use up to 3 times daily as directed. (FOR ICD-10 E11.65) 03/02/18   Roma Kayser, MD  cholecalciferol (VITAMIN D) 1000 units tablet Take 1,000 Units by mouth daily.    [provider]  diphenhydrAMINE (BENADRYL) 25 MG tablet Take 25 mg by mouth at bedtime.    [provider]  DiphenhydrAMINE HCl, Sleep, (SLEEP AID) 50 MG CAPS Take 50 mg by mouth at bedtime.    [provider]  glucose blood test strip 1 each by Other route 4 (four) times daily. Use as instructed 4 x daily. E11.65 09/27/17   Roma Kayser, MD  LANTUS SOLOSTAR 100  UNIT/ML Solostar Pen INJECT 90 UNITS SUBCUTANEOUSLY AT BEDTIME 06/27/18   Roma Kayser, MD  losartan (COZAAR) 50 MG tablet Take 50 mg by mouth daily.    [provider]  Melatonin 10 MG TABS Take 1 tablet by mouth at bedtime.    [provider]  metFORMIN (GLUCOPHAGE) 1000 MG tablet TAKE 1 TABLET BY MOUTH TWICE A DAY WITH A MEAL 05/31/18   Nida, Denman George, MD  metoprolol tartrate (LOPRESSOR) 25 MG tablet TAKE 1 TABLET BY MOUTH TWICE A DAY 08/04/18   Laqueta Linden, MD  Multiple Vitamin (MULTIVITAMIN WITH MINERALS) TABS tablet Take 1 tablet by mouth daily.    [provider]  nitroGLYCERIN (NITROSTAT) 0.4 MG SL tablet PLACE 1 TABLET UNDER THE TONGUE EVERY 5 MINUTES AS NEEDED 08/17/19   Laqueta Linden, MD  Omega-3 Fatty Acids (FISH OIL) 1000 MG CAPS Take 1 capsule by mouth 2 (two) times daily.    [provider]  Probiotic Product (PROBIOTIC-10) CAPS Take by mouth.    [provider]      Allergies    Nicotine    Review of Systems   Review of Systems  Cardiovascular:  Positive for chest pain.    Physical Exam Updated Vital Signs BP 110/71   Pulse 77   Temp 98.7 F (37.1 C) (Oral)   Resp 18  Ht 5\' 11"  (1.803 m)   Wt 106.6 kg   SpO2 93%   BMI 32.78 kg/m  Physical Exam Vitals and nursing note reviewed.  Constitutional:      General: He is not in acute distress.    Appearance: He is well-developed.  HENT:     Head: Normocephalic and atraumatic.  Eyes:     Conjunctiva/sclera: Conjunctivae normal.  Cardiovascular:     Rate and Rhythm: Normal rate and regular rhythm.     Heart sounds: Normal heart sounds. No murmur heard. Pulmonary:     Effort: Pulmonary effort is normal. No respiratory distress.     Breath sounds: Normal breath sounds.  Chest:     Chest wall: Tenderness present. No crepitus.  Abdominal:     Palpations: Abdomen is soft.     Tenderness: There is no abdominal tenderness.  Musculoskeletal:         General: No swelling.     Cervical back: Neck supple.  Skin:    General: Skin is warm and dry.     Capillary Refill: Capillary refill takes less than 2 seconds.  Neurological:     Mental Status: He is alert.  Psychiatric:        Mood and Affect: Mood normal.     ED Results / Procedures / Treatments   Labs (all labs ordered are listed, but only abnormal results are displayed) Labs Reviewed  BASIC METABOLIC PANEL - Abnormal; Notable for the following components:      Result Value   Glucose, Bld 146 (*)    BUN 35 (*)    Creatinine, Ser 1.55 (*)    GFR, Estimated 51 (*)    All other components within normal limits  CBC - Abnormal; Notable for the following components:   RBC 3.74 (*)    Hemoglobin 12.0 (*)    HCT 35.3 (*)    Platelets 111 (*)    All other components within normal limits  TROPONIN I (HIGH SENSITIVITY)  TROPONIN I (HIGH SENSITIVITY)    EKG EKG Interpretation Date/Time:  Tuesday March 22 2023 19:35:19 EDT Ventricular Rate:  81 PR Interval:  209 QRS Duration:  92 QT Interval:  363 QTC Calculation: 422 R Axis:   12  Text Interpretation: Sinus rhythm Borderline prolonged PR interval Baseline wander in lead(s) V4 Confirmed by Eber Hong ([id number]) on 03/22/2023 7:43:02 PM  Radiology DG Chest Portable 1 View  Result Date: 03/22/2023 CLINICAL DATA:  Left-sided chest pain. EXAM: PORTABLE CHEST 1 VIEW COMPARISON:  01/24/2017 FINDINGS: Median sternotomy.The cardiomediastinal contours are stable, heart size is unchanged. Stable right lung granuloma. Patient's chin partially obscures the apices. Pulmonary vasculature is normal. No consolidation, pleural effusion, or pneumothorax. No acute osseous abnormalities are seen. IMPRESSION: No acute chest findings. Median sternotomy. Electronically Signed   By: Narda Rutherford M.D.   On: 03/22/2023 23:39    Procedures Procedures    Medications Ordered in ED Medications  lactated ringers bolus 1,000 mL (0 mLs  Intravenous Stopped 03/22/23 2345)    ED Course/ Medical Decision Making/ A&P Clinical Course as of 03/22/23 2347  Tue Mar 22, 2023  2021 Basic metabolic panel(!) [CB]    Clinical Course User Index [CB] Ma Rings, PA-C                             Medical Decision Making The patient presented today for chest pain that had started justPTA while pushing a  tractor. EKG showed normal sinus rhythm, Chest Xray was independently reviewed by me and shows no pulmonary edema or infiltrate, no pneumothorax.  Median sternotomy noted. I agree with radiology interpretation.   They had nitroglycerin at home for symptoms which she changed from sharp to dull and much less severe  I considered a broad differential including but not limited to ACS, PE, Dissection, pneumothorax, costochondritis, pneumonia, GERD, pericarditis, and pericardial effusion.  PE is considered unlikely  I feel disssection is very unlikely  Symptoms and EKG are not consisted with pericarditis  Their heart score is 5. Troponins show delta of 1  Plan is for admit due to significant history and risk factors, discussed with hospitalist and consult is willing to admit for chest pain.   Amount and/or Complexity of Data Reviewed Labs: ordered. Decision-making details documented in ED Course. Radiology: ordered.  Risk Decision regarding hospitalization.           Final Clinical Impression(s) / ED Diagnoses Final diagnoses:  Other chest pain    Rx / DC Orders ED Discharge Orders     None         Josem Kaufmann 03/22/23 2347    Eber Hong, MD 03/23/23 1222

## 2023-03-23 ENCOUNTER — Encounter (HOSPITAL_COMMUNITY): Payer: Self-pay | Admitting: Family Medicine

## 2023-03-23 DIAGNOSIS — I10 Essential (primary) hypertension: Secondary | ICD-10-CM

## 2023-03-23 DIAGNOSIS — I25119 Atherosclerotic heart disease of native coronary artery with unspecified angina pectoris: Secondary | ICD-10-CM | POA: Diagnosis not present

## 2023-03-23 DIAGNOSIS — F17200 Nicotine dependence, unspecified, uncomplicated: Secondary | ICD-10-CM | POA: Diagnosis not present

## 2023-03-23 DIAGNOSIS — I2510 Atherosclerotic heart disease of native coronary artery without angina pectoris: Secondary | ICD-10-CM | POA: Insufficient documentation

## 2023-03-23 DIAGNOSIS — E785 Hyperlipidemia, unspecified: Secondary | ICD-10-CM | POA: Diagnosis not present

## 2023-03-23 DIAGNOSIS — R079 Chest pain, unspecified: Secondary | ICD-10-CM

## 2023-03-23 DIAGNOSIS — E1165 Type 2 diabetes mellitus with hyperglycemia: Secondary | ICD-10-CM

## 2023-03-23 DIAGNOSIS — E782 Mixed hyperlipidemia: Secondary | ICD-10-CM

## 2023-03-23 DIAGNOSIS — N179 Acute kidney failure, unspecified: Secondary | ICD-10-CM

## 2023-03-23 LAB — CBC WITH DIFFERENTIAL/PLATELET
Abs Immature Granulocytes: 0.02 10*3/uL (ref 0.00–0.07)
Basophils Absolute: 0 10*3/uL (ref 0.0–0.1)
Basophils Relative: 0 %
Eosinophils Absolute: 0.3 10*3/uL (ref 0.0–0.5)
Eosinophils Relative: 4 %
HCT: 35 % — ABNORMAL LOW (ref 39.0–52.0)
Hemoglobin: 11.6 g/dL — ABNORMAL LOW (ref 13.0–17.0)
Immature Granulocytes: 0 %
Lymphocytes Relative: 33 %
Lymphs Abs: 2.2 10*3/uL (ref 0.7–4.0)
MCH: 32 pg (ref 26.0–34.0)
MCHC: 33.1 g/dL (ref 30.0–36.0)
MCV: 96.7 fL (ref 80.0–100.0)
Monocytes Absolute: 0.6 10*3/uL (ref 0.1–1.0)
Monocytes Relative: 9 %
Neutro Abs: 3.5 10*3/uL (ref 1.7–7.7)
Neutrophils Relative %: 54 %
Platelets: 101 10*3/uL — ABNORMAL LOW (ref 150–400)
RBC: 3.62 MIL/uL — ABNORMAL LOW (ref 4.22–5.81)
RDW: 13.3 % (ref 11.5–15.5)
WBC: 6.6 10*3/uL (ref 4.0–10.5)
nRBC: 0 % (ref 0.0–0.2)

## 2023-03-23 LAB — COMPREHENSIVE METABOLIC PANEL
ALT: 21 U/L (ref 0–44)
AST: 25 U/L (ref 15–41)
Albumin: 3.9 g/dL (ref 3.5–5.0)
Alkaline Phosphatase: 45 U/L (ref 38–126)
Anion gap: 9 (ref 5–15)
BUN: 36 mg/dL — ABNORMAL HIGH (ref 6–20)
CO2: 25 mmol/L (ref 22–32)
Calcium: 8.7 mg/dL — ABNORMAL LOW (ref 8.9–10.3)
Chloride: 102 mmol/L (ref 98–111)
Creatinine, Ser: 1.41 mg/dL — ABNORMAL HIGH (ref 0.61–1.24)
GFR, Estimated: 57 mL/min — ABNORMAL LOW (ref >60)
Glucose, Bld: 204 mg/dL — ABNORMAL HIGH (ref 70–99)
Potassium: 4.1 mmol/L (ref 3.5–5.1)
Sodium: 136 mmol/L (ref 135–145)
Total Bilirubin: 0.6 mg/dL (ref 0.3–1.2)
Total Protein: 6.6 g/dL (ref 6.5–8.1)

## 2023-03-23 LAB — HIV ANTIBODY (ROUTINE TESTING W REFLEX): HIV Screen 4th Generation wRfx: NONREACTIVE

## 2023-03-23 LAB — HEMOGLOBIN A1C
Hgb A1c MFr Bld: 7.4 % — ABNORMAL HIGH (ref 4.8–5.6)
Mean Plasma Glucose: 165.68 mg/dL

## 2023-03-23 LAB — CBG MONITORING, ED: Glucose-Capillary: 199 mg/dL — ABNORMAL HIGH (ref 70–99)

## 2023-03-23 LAB — MAGNESIUM: Magnesium: 1.9 mg/dL (ref 1.7–2.4)

## 2023-03-23 MED ORDER — METOPROLOL TARTRATE 25 MG PO TABS: 25.0000 mg | ORAL_TABLET | Freq: Two times a day (BID) | ORAL | Status: DC

## 2023-03-23 MED ORDER — INSULIN ASPART 100 UNIT/ML IJ SOLN: 0.0000 [IU] | Freq: Every day | INTRAMUSCULAR | Status: DC

## 2023-03-23 MED ORDER — ATORVASTATIN CALCIUM 40 MG PO TABS: 40.0000 mg | ORAL_TABLET | Freq: Every day | ORAL | Status: DC

## 2023-03-23 MED ORDER — MORPHINE SULFATE (PF) 2 MG/ML IV SOLN: 2.0000 mg | INTRAVENOUS | Status: DC | PRN

## 2023-03-23 MED ORDER — DIPHENHYDRAMINE HCL 25 MG PO CAPS: 50.0000 mg | ORAL_CAPSULE | Freq: Every day | ORAL | Status: DC

## 2023-03-23 MED ORDER — OXYCODONE HCL 5 MG PO TABS: 5.0000 mg | ORAL_TABLET | ORAL | Status: DC | PRN

## 2023-03-23 MED ORDER — CLOPIDOGREL BISULFATE 75 MG PO TABS: 75.0000 mg | ORAL_TABLET | Freq: Every day | ORAL | Status: DC

## 2023-03-23 MED ORDER — INSULIN ASPART 100 UNIT/ML IJ SOLN
0.0000 [IU] | Freq: Three times a day (TID) | INTRAMUSCULAR | Status: DC
  Administered 2023-03-23: 3 [IU] via SUBCUTANEOUS
  Filled 2023-03-23: qty 1

## 2023-03-23 MED ORDER — ONDANSETRON HCL 4 MG PO TABS: 4.0000 mg | ORAL_TABLET | Freq: Four times a day (QID) | ORAL | Status: DC | PRN

## 2023-03-23 MED ORDER — HEPARIN SODIUM (PORCINE) 5000 UNIT/ML IJ SOLN
5000.0000 [IU] | Freq: Three times a day (TID) | INTRAMUSCULAR | Status: DC
  Filled 2023-03-23: qty 1

## 2023-03-23 MED ORDER — ISOSORBIDE MONONITRATE ER 30 MG PO TB24: 30.0000 mg | ORAL_TABLET | Freq: Every day | ORAL | Status: DC

## 2023-03-23 MED ORDER — ACETAMINOPHEN 325 MG PO TABS: 650.0000 mg | ORAL_TABLET | Freq: Four times a day (QID) | ORAL | Status: DC | PRN

## 2023-03-23 MED ORDER — ACETAMINOPHEN 650 MG RE SUPP: 650.0000 mg | Freq: Four times a day (QID) | RECTAL | Status: DC | PRN

## 2023-03-23 MED ORDER — GABAPENTIN 400 MG PO CAPS: 400.0000 mg | ORAL_CAPSULE | Freq: Two times a day (BID) | ORAL | Status: DC

## 2023-03-23 MED ORDER — ASPIRIN 81 MG PO TBEC: 81.0000 mg | DELAYED_RELEASE_TABLET | Freq: Every day | ORAL | Status: DC

## 2023-03-23 MED ORDER — INSULIN GLARGINE-YFGN 100 UNIT/ML ~~LOC~~ SOLN
55.0000 [IU] | Freq: Every day | SUBCUTANEOUS | Status: DC
  Filled 2023-03-23: qty 0.55

## 2023-03-23 MED ORDER — ONDANSETRON HCL 4 MG/2ML IJ SOLN: 4.0000 mg | Freq: Four times a day (QID) | INTRAMUSCULAR | Status: DC | PRN

## 2023-03-23 MED ORDER — LOSARTAN POTASSIUM 25 MG PO TABS: 50.0000 mg | ORAL_TABLET | Freq: Every day | ORAL | Status: DC

## 2023-03-23 MED ORDER — ALBUTEROL SULFATE (2.5 MG/3ML) 0.083% IN NEBU: 2.5000 mg | INHALATION_SOLUTION | RESPIRATORY_TRACT | Status: DC | PRN

## 2023-03-23 MED ORDER — ROSUVASTATIN CALCIUM 20 MG PO TABS: 20.0000 mg | ORAL_TABLET | Freq: Every day | ORAL | Status: DC

## 2023-03-23 MED ORDER — SODIUM CHLORIDE 0.9 % IV SOLN: INTRAVENOUS | Status: DC

## 2023-03-23 NOTE — ED Notes (Signed)
Pt and wife expressed irritation about having admission orders. MD notified.

## 2023-03-23 NOTE — Discharge Summary (Signed)
Physician Discharge Summary  Brad Olson UJW:[id number] DOB: Oct 03, 1960 DOA: 03/22/2023  PCP: Kelli Hope, MD  Admit date: 03/22/2023  Discharge date: 03/23/2023  Admitted From:Home  Disposition:  Home  Recommendations for Outpatient Follow-up:  Follow up with PCP in 1-2 weeks Follow-up BMP in 1 week to reassess creatinine levels Continue home medications as prior Follow-up with cardiologist Dr. Earna Coder in [place], [place]  Home Health:None  Equipment/Devices:None  Discharge Condition:Stable  CODE STATUS: Full  Diet recommendation: Heart Healthy/Carb Modified diet  Brief/Interim Summary:  Brad Olson is a 62 y.o. male with medical history significant of diabetes mellitus without complication, hyperlipidemia, obesity, tobacco use disorder, CAD with quadruple bypass, and more presents to the ED with a chief complaint of chest pain.  He appears to have chest pain that was concerning for exertional angina and was seen by cardiology briefly and was offered inpatient stress testing versus outpatient follow-up and requests to follow-up with his cardiologist.  He does not have any ACS or recurrent pain at this time and is adamant for discharge.  He was noted to have some AKI with elevation in creatinine levels and received IV fluid with downward trending creatinine level noted.  It is not quite back to baseline and therefore it is recommended that he have repeat labs performed in approximately 7 to 10 days.  No other acute events or concerns noted.  Discharge Diagnoses:  Principal Problem:   Chest pain Active Problems:   Current smoker   Mixed hyperlipidemia   Essential hypertension, benign   Uncontrolled type 2 diabetes mellitus with hyperglycemia (HCC)   Coronary artery disease   AKI (acute kidney injury) (HCC)  Principal discharge diagnosis: Chest pain with exertional angina.  Discharge Instructions  Discharge Instructions     Diet - low sodium heart healthy    Complete by: As directed    Increase activity slowly   Complete by: As directed       Allergies as of 03/23/2023       Reactions   Wound Dressing Adhesive Dermatitis   Varenicline Tartrate Nausea And Vomiting   Lisinopril Cough   Nicotine Rash   Allergic to Nicotine patch.        Medication List     TAKE these medications    ascorbic acid 500 MG tablet Commonly known as: VITAMIN C Take 500 mg by mouth daily.   aspirin EC 81 MG tablet Take 1 tablet (81 mg total) by mouth daily.   atorvastatin 40 MG tablet Commonly known as: LIPITOR TAKE 1 TABLET BY MOUTH EVERY DAY   blood glucose meter kit and supplies Kit Dispense based on patient and insurance preference. Use up to 3 times daily as directed. (FOR ICD-10 E11.65)   cholecalciferol 1000 units tablet Commonly known as: VITAMIN D Take 5,000 Units by mouth daily.   clopidogrel 75 MG tablet Commonly known as: PLAVIX Take 75 mg by mouth once.   diphenhydrAMINE 25 MG tablet Commonly known as: BENADRYL Take 25 mg by mouth at bedtime.   Fish Oil 1000 MG Caps Take 1 capsule by mouth 2 (two) times daily.   gabapentin 600 MG tablet Commonly known as: NEURONTIN Take 600 mg by mouth 2 (two) times daily. Lunch and bedtime   glucose blood test strip 1 each by Other route 4 (four) times daily. Use as instructed 4 x daily. E11.65   insulin degludec 200 UNIT/ML FlexTouch Pen Commonly known as: TRESIBA Inject 55 Units into the skin 2 (two) times daily. 55  units AM and 55 units PM   isosorbide mononitrate 30 MG 24 hr tablet Commonly known as: IMDUR Take 30 mg by mouth daily.   Januvia 50 MG tablet Generic drug: sitaGLIPtin Take 50 mg by mouth daily.   Lantus SoloStar 100 UNIT/ML Solostar Pen Generic drug: insulin glargine INJECT 90 UNITS SUBCUTANEOUSLY AT BEDTIME   losartan 25 MG tablet Commonly known as: COZAAR Take 50 mg by mouth daily.   Melatonin 10 MG Tabs Take 1 tablet by mouth at bedtime.   metFORMIN  1000 MG tablet Commonly known as: GLUCOPHAGE TAKE 1 TABLET BY MOUTH TWICE A DAY WITH A MEAL   metoprolol tartrate 25 MG tablet Commonly known as: LOPRESSOR TAKE 1 TABLET BY MOUTH TWICE A DAY   multivitamin with minerals Tabs tablet Take 1 tablet by mouth daily.   nateglinide 120 MG tablet Commonly known as: STARLIX Take 120 mg by mouth 3 (three) times daily with meals.   nitroGLYCERIN 0.4 MG SL tablet Commonly known as: NITROSTAT PLACE 1 TABLET UNDER THE TONGUE EVERY 5 MINUTES AS NEEDED   oxymetazoline 0.05 % nasal spray Commonly known as: AFRIN Place 2 sprays into both nostrils 2 (two) times daily as needed (allergies).   Ozempic (2 MG/DOSE) 8 MG/3ML Sopn Generic drug: Semaglutide (2 MG/DOSE) Inject 2 mg into the skin once a week.   pantoprazole 40 MG tablet Commonly known as: PROTONIX Take 40 mg by mouth daily.   Probiotic-10 Caps Take by mouth.   rosuvastatin 20 MG tablet Commonly known as: CRESTOR Take 20 mg by mouth at bedtime.   tamsulosin 0.4 MG Caps capsule Commonly known as: FLOMAX Take 0.4 mg by mouth at bedtime.   Vitamin E 180 MG (400 UNIT) Caps Take 400 Units by mouth every morning.        Follow-up Information     Ave Filter, MD. Schedule an appointment as soon as possible for a visit in 2 week(s).   Specialty: Cardiology Contact information: [address] [place] [address] [phone]                Allergies  Allergen Reactions   Wound Dressing Adhesive Dermatitis   Varenicline Tartrate Nausea And Vomiting   Lisinopril Cough   Nicotine Rash    Allergic to Nicotine patch.    Consultations: Cardiology   Procedures/Studies: DG Chest Portable 1 View  Result Date: 03/22/2023 CLINICAL DATA:  Left-sided chest pain. EXAM: PORTABLE CHEST 1 VIEW COMPARISON:  01/24/2017 FINDINGS: Median sternotomy.The cardiomediastinal contours are stable, heart size is unchanged. Stable right lung granuloma. Patient's chin  partially obscures the apices. Pulmonary vasculature is normal. No consolidation, pleural effusion, or pneumothorax. No acute osseous abnormalities are seen. IMPRESSION: No acute chest findings. Median sternotomy. Electronically Signed   By: Narda Rutherford M.D.   On: 03/22/2023 23:39     Discharge Exam: Vitals:   03/23/23 0644 03/23/23 0728  BP: (!) 141/55   Pulse: 74   Resp: 18   Temp:  97.9 F (36.6 C)  SpO2: 93%    Vitals:   03/23/23 0335 03/23/23 0518 03/23/23 0644 03/23/23 0728  BP: 109/64  (!) 141/55   Pulse: 70  74   Resp: 14 14 18    Temp:    97.9 F (36.6 C)  TempSrc:    Oral  SpO2: 100% 100% 93%   Weight:      Height:        General: Pt is alert, awake, not in acute  distress, obese Cardiovascular: RRR, S1/S2 +, no rubs, no gallops Respiratory: CTA bilaterally, no wheezing, no rhonchi Abdominal: Soft, NT, ND, bowel sounds + Extremities: no edema, no cyanosis    The results of significant diagnostics from this hospitalization (including imaging, microbiology, ancillary and laboratory) are listed below for reference.     Microbiology: No results found for this or any previous visit (from the past 240 hour(s)).   Labs: BNP (last 3 results) No results for input(s): "BNP" in the last 8760 hours. Basic Metabolic Panel: Recent Labs  Lab 03/22/23 1939 03/23/23 0340  NA 138 136  K 4.5 4.1  CL 102 102  CO2 25 25  GLUCOSE 146* 204*  BUN 35* 36*  CREATININE 1.55* 1.41*  CALCIUM 9.0 8.7*  MG  --  1.9   Liver Function Tests: Recent Labs  Lab 03/23/23 0340  AST 25  ALT 21  ALKPHOS 45  BILITOT 0.6  PROT 6.6  ALBUMIN 3.9   No results for input(s): "LIPASE", "AMYLASE" in the last 168 hours. No results for input(s): "AMMONIA" in the last 168 hours. CBC: Recent Labs  Lab 03/22/23 1939 03/23/23 0340  WBC 6.7 6.6  NEUTROABS  --  3.5  HGB 12.0* 11.6*  HCT 35.3* 35.0*  MCV 94.4 96.7  PLT 111* 101*   Cardiac Enzymes: No results for input(s):  "CKTOTAL", "CKMB", "CKMBINDEX", "TROPONINI" in the last 168 hours. BNP: Invalid input(s): "POCBNP" CBG: Recent Labs  Lab 03/23/23 0729  GLUCAP 199*   D-Dimer No results for input(s): "DDIMER" in the last 72 hours. Hgb A1c No results for input(s): "HGBA1C" in the last 72 hours. Lipid Profile No results for input(s): "CHOL", "HDL", "LDLCALC", "TRIG", "CHOLHDL", "LDLDIRECT" in the last 72 hours. Thyroid function studies No results for input(s): "TSH", "T4TOTAL", "T3FREE", "THYROIDAB" in the last 72 hours.  Invalid input(s): "FREET3" Anemia work up No results for input(s): "VITAMINB12", "FOLATE", "FERRITIN", "TIBC", "IRON", "RETICCTPCT" in the last 72 hours. Urinalysis No results found for: "COLORURINE", "APPEARANCEUR", "LABSPEC", "PHURINE", "GLUCOSEU", "HGBUR", "BILIRUBINUR", "KETONESUR", "PROTEINUR", "UROBILINOGEN", "NITRITE", "LEUKOCYTESUR" Sepsis Labs Recent Labs  Lab 03/22/23 1939 03/23/23 0340  WBC 6.7 6.6   Microbiology No results found for this or any previous visit (from the past 240 hour(s)).   Time coordinating discharge: 35 minutes  SIGNED:   Erick Blinks, DO [facility] 03/23/2023, 8:55 AM  If 7PM-7AM, please contact night-coverage [email or web address]

## 2023-03-23 NOTE — H&P (Signed)
History and Physical    PatientRomelo Olson ZOX:[id number] DOB: Oct 17, 1960 DOA: 03/22/2023 DOS: the patient was seen and examined on 03/23/2023 PCP: Kelli Hope, MD  Patient coming from: Home  Chief Complaint:  Chief Complaint  Patient presents with   Chest Pain   HPI: Brad Olson is a 62 y.o. male with medical history significant of diabetes mellitus without complication, hyperlipidemia, obesity, tobacco use disorder, CAD with quadruple bypass, and more presents to the ED with a chief complaint of chest pain.  Chest pain started when patient was exerting himself by pushing a mower across the yard.  It felt sharp pains in the left side of his chest.  He had no nausea.  He did feel lightheaded, diaphoretic, short of breath, and generally weak.  He took a nitro and the pain was still there he waited about 10 minutes and took another nitro.  The pain was still there so we another 10 minutes and took another nitro.  After the third nitro, approximately 15 minutes after, patient's pain went away.  He reports he had no recurrence of the pain since he has been in the ED.  Changing positions in his body does not bring back the pain.  Palpating the chest does not trigger the pain either.  Patient has no other complaints at this time.  Patient does smoke.  He does not drink.  Is full code. Review of Systems: As mentioned in the history of present illness. All other systems reviewed and are negative. Past Medical History:  Diagnosis Date   Diabetes mellitus without complication (HCC)    Hyperlipidemia    Obesity    Tobacco abuse    Past Surgical History:  Procedure Laterality Date   ABDOMINAL AORTOGRAM N/A 12/22/2016   Procedure: Abdominal Aortogram;  Surgeon: Lennette Bihari, MD;  Location: [facility];  Service: Cardiovascular;  Laterality: N/A;   COLON RESECTION     due to bowel rupture   CORONARY ARTERY BYPASS GRAFT N/A 12/22/2016   Procedure: CORONARY ARTERY BYPASS  GRAFTING (CABG) x four using left internal mammary artery and right greater saphenous leg vein using endoscope.  Lima-LAD, SVG-PD, SVG-OM, SVG-Diag.;  Surgeon: Delight Ovens, MD;  Location: [facility] OR;  Service: Open Heart Surgery;  Laterality: N/A;   IABP INSERTION N/A 12/22/2016   Procedure: IABP Insertion;  Surgeon: Lennette Bihari, MD;  Location: [facility];  Service: Cardiovascular;  Laterality: N/A;   LEFT HEART CATH AND CORONARY ANGIOGRAPHY N/A 12/22/2016   Procedure: Left Heart Cath and Coronary Angiography;  Surgeon: Lennette Bihari, MD;  Location: [facility];  Service: Cardiovascular;  Laterality: N/A;   TEE WITHOUT CARDIOVERSION N/A 12/22/2016   Procedure: TRANSESOPHAGEAL ECHOCARDIOGRAM (TEE);  Surgeon: Delight Ovens, MD;  Location: [facility] OR;  Service: Open Heart Surgery;  Laterality: N/A;   Social History:  reports that he has been smoking cigarettes. He started smoking about 40 years ago. He has never used smokeless tobacco. He reports current alcohol use. He reports that he does not use drugs.  Allergies  Allergen Reactions   Nicotine Rash    Allergic to Nicotine patch.    Family History  Problem Relation Age of Onset   CAD Father        died of massive MI at age 73, first MI several years earlier   Lung disease Father     Prior to Admission medications   Medication Sig Start Date End Date Taking? Authorizing  Provider  aspirin EC 81 MG tablet Take 1 tablet (81 mg total) by mouth daily. 04/22/17   Laqueta Linden, MD  atorvastatin (LIPITOR) 40 MG tablet TAKE 1 TABLET BY MOUTH EVERY DAY 03/30/18   Laqueta Linden, MD  blood glucose meter kit and supplies KIT Dispense based on patient and insurance preference. Use up to 3 times daily as directed. (FOR ICD-10 E11.65) 03/02/18   Roma Kayser, MD  cholecalciferol (VITAMIN D) 1000 units tablet Take 1,000 Units by mouth daily.    [provider]  diphenhydrAMINE (BENADRYL) 25 MG tablet Take 25 mg  by mouth at bedtime.    [provider]  DiphenhydrAMINE HCl, Sleep, (SLEEP AID) 50 MG CAPS Take 50 mg by mouth at bedtime.    [provider]  glucose blood test strip 1 each by Other route 4 (four) times daily. Use as instructed 4 x daily. E11.65 09/27/17   Roma Kayser, MD  LANTUS SOLOSTAR 100 UNIT/ML Solostar Pen INJECT 90 UNITS SUBCUTANEOUSLY AT BEDTIME 06/27/18   Roma Kayser, MD  losartan (COZAAR) 50 MG tablet Take 50 mg by mouth daily.    [provider]  Melatonin 10 MG TABS Take 1 tablet by mouth at bedtime.    [provider]  metFORMIN (GLUCOPHAGE) 1000 MG tablet TAKE 1 TABLET BY MOUTH TWICE A DAY WITH A MEAL 05/31/18   Nida, Denman George, MD  metoprolol tartrate (LOPRESSOR) 25 MG tablet TAKE 1 TABLET BY MOUTH TWICE A DAY 08/04/18   Laqueta Linden, MD  Multiple Vitamin (MULTIVITAMIN WITH MINERALS) TABS tablet Take 1 tablet by mouth daily.    [provider]  nitroGLYCERIN (NITROSTAT) 0.4 MG SL tablet PLACE 1 TABLET UNDER THE TONGUE EVERY 5 MINUTES AS NEEDED 08/17/19   Laqueta Linden, MD  Omega-3 Fatty Acids (FISH OIL) 1000 MG CAPS Take 1 capsule by mouth 2 (two) times daily.    [provider]  Probiotic Product (PROBIOTIC-10) CAPS Take by mouth.    [provider]    Physical Exam: Vitals:   03/22/23 1938 03/22/23 2100 03/22/23 2130 03/23/23 0100  BP: (!) 120/51 (!) 116/47 110/71 120/74  Pulse: 81 78 77 70  Resp: 18 18 18 18   Temp: 98.7 F (37.1 C)     TempSrc: Oral     SpO2: 94% 94% 93% 93%  Weight:      Height:       1.  General: Sitting in chair at side of bed,  no acute distress   2. Psychiatric: Alert and oriented x 3, mood and behavior normal for situation, pleasant and cooperative with exam   3. Neurologic: Speech and language are normal, face is symmetric, moves all 4 extremities voluntarily, at baseline without acute deficits on limited exam   4. HEENMT:  Head is  atraumatic, normocephalic, pupils reactive to light, neck is supple, trachea is midline, mucous membranes are moist   5. Respiratory : Mild wheezing bilaterally, no rhonchi, rales, no cyanosis, no increase in work of breathing or accessory muscle use   6. Cardiovascular : Heart rate normal, rhythm is regular, no rubs or gallops, no peripheral edema, peripheral pulses palpated   7. Gastrointestinal:  Abdomen is soft, nondistended, nontender ventral hernia noted, nontender to palpation bowel sounds active, no masses or organomegaly palpated   8. Skin:  Skin is warm, dry and intact without rashes, acute lesions, or ulcers on limited exam   9.Musculoskeletal:  No acute deformities or  trauma, no asymmetry in tone, no peripheral edema, peripheral pulses palpated, no tenderness to palpation in the extremities  Data Reviewed: In the ED Temp 98.7, heart rate 77-81, respiratory 18, blood pressure 110/47-120/71, satting 93-94% on room air No leukocytosis with white blood cell count of 6.7, hemoglobin 4.0, platelets 111 Chemistry feels a bump in creatinine at 1.55 Trope 5, 6 EKG is nonischemic 1 L bolus in the ED Admission requested for high risk chest pain  Assessment and Plan: * Chest pain - High risk chest pain patient - Reported 5, 6 - EKG shows a heart rate of 81, sinus rhythm, QTc 422 with baseline wander but no acute ischemic changes - Patient reports his pain felt like when he had a heart attack before - It was relieved by 3 nitro - Patient is stable now and pain-free, could likely be discharged in the a.m. after cardiology consult  AKI (acute kidney injury) (HCC) - Last creatinine was 12/22/2022 and was 1.1 - Today's creatinine is 1.55 - Likely related to dehydration as patient was working in hot sun and reports his clothes were remaining wet with sweat -  encourage e p.o. fluid intake and trend in the a.m. - If no improvement in the a.m. consider holding ARB, NSAIDs, other  nephrotoxic agents  Coronary artery disease - Status post quadruple bypass - Continue aspirin, statin, Plavix, Imdur, beta-blocker, ARB for medical optimization - Monitor on telemetry - Cardiology consult in a.m.  Uncontrolled type 2 diabetes mellitus with hyperglycemia (HCC) - Patient takes 55 units of insulin twice daily at home - Continue 55 units once daily here - Sliding scale coverage - Currently n.p.o. patient seen by cardiology in the a.m.  Essential hypertension, benign - Continue ARB  Mixed hyperlipidemia - Continue statin medication  Current smoker - Down to 1 pack/day - Allergic to nicotine patches - Offered nicotine gum, but denied - Advised on the importance of quitting      Advance Care Planning:   Code Status: Full Code  Consults: Cardiac   Family Communication: Wife at bedside  Severity of Illness: The appropriate patient status for this patient is OBSERVATION. Observation status is judged to be reasonable and necessary in order to provide the required intensity of service to ensure the patient's safety. The patient's presenting symptoms, physical exam findings, and initial radiographic and laboratory data in the context of their medical condition is felt to place them at decreased risk for further clinical deterioration. Furthermore, it is anticipated that the patient will be medically stable for discharge from the hospital within 2 midnights of admission.   Author: Lilyan Gilford, DO 03/23/2023 3:44 AM  For on call review [email or web address].

## 2023-03-23 NOTE — Assessment & Plan Note (Addendum)
-   High risk chest pain patient - Reported 5, 6 - EKG shows a heart rate of 81, sinus rhythm, QTc 422 with baseline wander but no acute ischemic changes - Patient reports his pain felt like when he had a heart attack before - It was relieved by 3 nitro - Patient is stable now and pain-free, could likely be discharged in the a.m. after cardiology consult

## 2023-03-23 NOTE — ED Notes (Signed)
Updated IP nurse on status of admission

## 2023-03-23 NOTE — ED Notes (Signed)
IP nurse updated on new order to DC patient from ED. ED RN waiting on AVS documentation from MD.

## 2023-03-23 NOTE — ED Notes (Signed)
ED TO INPATIENT HANDOFF REPORT  ED Nurse Name and Phone #: [phone]  S Name/Age/Gender Brad Olson 62 y.o. male Room/Bed: APA06/APA06  Code Status   Code Status: Full Code  Home/SNF/Other Home Patient oriented to: self, place, time, and situation Is this baseline? Yes   Triage Complete: Triage complete  Chief Complaint Chest pain [R07.9]  Triage Note Pt states he started having left sided chest pain while pulling a riding lawnmower around 4:45pm today.  Reports it feels like his previous heart attacks.  Reports taking 3 nitroglycerin tabs prior to arrival.  Denies N/V/D, reports SOB, dizziness & weakness.  Describes pain as sharp & pulling at onset & now is dull, pressure, pulling.    Allergies Allergies  Allergen Reactions   Nicotine Rash    Allergic to Nicotine patch.    Level of Care/Admitting Diagnosis ED Disposition     ED Disposition  Admit   Condition  --   Comment  Hospital Area: [facility] [[id number]]  Level of Care: Telemetry [5]  Covid Evaluation: Asymptomatic - no recent exposure (last 10 days) testing not required  Diagnosis: Chest pain [454098]  Admitting Physician: Lilyan Gilford [[id number]]  Attending Physician: Lilyan Gilford [[id number]]          B Medical/Surgery History Past Medical History:  Diagnosis Date   Diabetes mellitus without complication (HCC)    Hyperlipidemia    Obesity    Tobacco abuse    Past Surgical History:  Procedure Laterality Date   ABDOMINAL AORTOGRAM N/A 12/22/2016   Procedure: Abdominal Aortogram;  Surgeon: Lennette Bihari, MD;  Location: [facility];  Service: Cardiovascular;  Laterality: N/A;   COLON RESECTION     due to bowel rupture   CORONARY ARTERY BYPASS GRAFT N/A 12/22/2016   Procedure: CORONARY ARTERY BYPASS GRAFTING (CABG) x four using left internal mammary artery and right greater saphenous leg vein using endoscope.  Lima-LAD, SVG-PD, SVG-OM, SVG-Diag.;  Surgeon: Delight Ovens, MD;  Location: [facility] OR;  Service: Open Heart Surgery;  Laterality: N/A;   IABP INSERTION N/A 12/22/2016   Procedure: IABP Insertion;  Surgeon: Lennette Bihari, MD;  Location: [facility];  Service: Cardiovascular;  Laterality: N/A;   LEFT HEART CATH AND CORONARY ANGIOGRAPHY N/A 12/22/2016   Procedure: Left Heart Cath and Coronary Angiography;  Surgeon: Lennette Bihari, MD;  Location: [facility];  Service: Cardiovascular;  Laterality: N/A;   TEE WITHOUT CARDIOVERSION N/A 12/22/2016   Procedure: TRANSESOPHAGEAL ECHOCARDIOGRAM (TEE);  Surgeon: Delight Ovens, MD;  Location: [facility] OR;  Service: Open Heart Surgery;  Laterality: N/A;     A IV Location/Drains/Wounds Patient Lines/Drains/Airways Status     Active Line/Drains/Airways     Name Placement date Placement time Site Days   Peripheral IV 03/22/23 20 G Anterior;Distal;Right;Upper Arm 03/22/23  1938  Arm  1   Airway 7.5 mm 12/22/16  1429  -- 2282   Incision (Closed) 12/22/16 Chest Other (Comment) 12/22/16  1412  -- 2282   Incision (Closed) 12/22/16 Leg Right 12/22/16  1412  -- 2282            Intake/Output Last 24 hours  Intake/Output Summary (Last 24 hours) at 03/23/2023 0717 Last data filed at 03/22/2023 2345 Gross per 24 hour  Intake 1000 ml  Output --  Net 1000 ml    Labs/Imaging Results for orders placed or performed during the hospital encounter of 03/22/23 (from the past 48 hour(s))  Basic  metabolic panel     Status: Abnormal   Collection Time: 03/22/23  7:39 PM  Result Value Ref Range   Sodium 138 135 - 145 mmol/L   Potassium 4.5 3.5 - 5.1 mmol/L   Chloride 102 98 - 111 mmol/L   CO2 25 22 - 32 mmol/L   Glucose, Bld 146 (H) 70 - 99 mg/dL    Comment: Glucose reference range applies only to samples taken after fasting for at least 8 hours.   BUN 35 (H) 6 - 20 mg/dL   Creatinine, Ser 1.61 (H) 0.61 - 1.24 mg/dL   Calcium 9.0 8.9 - 09.6 mg/dL   GFR, Estimated 51 (L) >60 mL/min    Comment:  (NOTE) Calculated using the CKD-EPI Creatinine Equation (2021)    Anion gap 11 5 - 15    Comment: Performed at [facility], [address] [place], [place] [address]  CBC     Status: Abnormal   Collection Time: 03/22/23  7:39 PM  Result Value Ref Range   WBC 6.7 4.0 - 10.5 K/uL   RBC 3.74 (L) 4.22 - 5.81 MIL/uL   Hemoglobin 12.0 (L) 13.0 - 17.0 g/dL   HCT 98.1 (L) 19.1 - 47.8 %   MCV 94.4 80.0 - 100.0 fL   MCH 32.1 26.0 - 34.0 pg   MCHC 34.0 30.0 - 36.0 g/dL   RDW 29.5 62.1 - 30.8 %   Platelets 111 (L) 150 - 400 K/uL    Comment: SPECIMEN CHECKED FOR CLOTS REPEATED TO VERIFY    nRBC 0.0 0.0 - 0.2 %    Comment: Performed at [facility], [address], [place], [place] [address]  Troponin I (High Sensitivity)     Status: None   Collection Time: 03/22/23  7:39 PM  Result Value Ref Range   Troponin I (High Sensitivity) 5 <18 ng/L    Comment: (NOTE) Elevated high sensitivity troponin I (hsTnI) values and significant  changes across serial measurements may suggest ACS but many other  chronic and acute conditions are known to elevate hsTnI results.  Refer to the "Links" section for chest pain algorithms and additional  guidance. Performed at [facility], [address], [place], [place] [address]   Troponin I (High Sensitivity)     Status: None   Collection Time: 03/22/23  9:55 PM  Result Value Ref Range   Troponin I (High Sensitivity) 6 <18 ng/L    Comment: (NOTE) Elevated high sensitivity troponin I (hsTnI) values and significant  changes across serial measurements may suggest ACS but many other  chronic and acute conditions are known to elevate hsTnI results.  Refer to the "Links" section for chest pain algorithms and additional  guidance. Performed at [facility], [address], [place], [place] [address]   Comprehensive metabolic panel     Status: Abnormal   Collection Time: 03/23/23  3:40 AM  Result Value Ref Range   Sodium 136 135 - 145 mmol/L   Potassium 4.1 3.5 -  5.1 mmol/L   Chloride 102 98 - 111 mmol/L   CO2 25 22 - 32 mmol/L   Glucose, Bld 204 (H) 70 - 99 mg/dL    Comment: Glucose reference range applies only to samples taken after fasting for at least 8 hours.   BUN 36 (H) 6 - 20 mg/dL   Creatinine, Ser 3.24 (H) 0.61 - 1.24 mg/dL   Calcium 8.7 (L) 8.9 - 10.3 mg/dL   Total Protein 6.6 6.5 - 8.1 g/dL  Albumin 3.9 3.5 - 5.0 g/dL   AST 25 15 - 41 U/L   ALT 21 0 - 44 U/L   Alkaline Phosphatase 45 38 - 126 U/L   Total Bilirubin 0.6 0.3 - 1.2 mg/dL   GFR, Estimated 57 (L) >60 mL/min    Comment: (NOTE) Calculated using the CKD-EPI Creatinine Equation (2021)    Anion gap 9 5 - 15    Comment: Performed at [facility], [address] [place], [place] [address]  Magnesium     Status: None   Collection Time: 03/23/23  3:40 AM  Result Value Ref Range   Magnesium 1.9 1.7 - 2.4 mg/dL    Comment: Performed at [facility], [address], [place], [place] [address]  CBC with Differential/Platelet     Status: Abnormal   Collection Time: 03/23/23  3:40 AM  Result Value Ref Range   WBC 6.6 4.0 - 10.5 K/uL   RBC 3.62 (L) 4.22 - 5.81 MIL/uL   Hemoglobin 11.6 (L) 13.0 - 17.0 g/dL   HCT 81.2 (L) 75.1 - 70.0 %   MCV 96.7 80.0 - 100.0 fL   MCH 32.0 26.0 - 34.0 pg   MCHC 33.1 30.0 - 36.0 g/dL   RDW 17.4 94.4 - 96.7 %   Platelets 101 (L) 150 - 400 K/uL   nRBC 0.0 0.0 - 0.2 %   Neutrophils Relative % 54 %   Neutro Abs 3.5 1.7 - 7.7 K/uL   Lymphocytes Relative 33 %   Lymphs Abs 2.2 0.7 - 4.0 K/uL   Monocytes Relative 9 %   Monocytes Absolute 0.6 0.1 - 1.0 K/uL   Eosinophils Relative 4 %   Eosinophils Absolute 0.3 0.0 - 0.5 K/uL   Basophils Relative 0 %   Basophils Absolute 0.0 0.0 - 0.1 K/uL   Immature Granulocytes 0 %   Abs Immature Granulocytes 0.02 0.00 - 0.07 K/uL    Comment: Performed at [facility], [address] [place], [place] [address]   DG Chest Portable 1 View  Result Date: 03/22/2023 CLINICAL DATA:  Left-sided chest pain. EXAM:  PORTABLE CHEST 1 VIEW COMPARISON:  01/24/2017 FINDINGS: Median sternotomy.The cardiomediastinal contours are stable, heart size is unchanged. Stable right lung granuloma. Patient's chin partially obscures the apices. Pulmonary vasculature is normal. No consolidation, pleural effusion, or pneumothorax. No acute osseous abnormalities are seen. IMPRESSION: No acute chest findings. Median sternotomy. Electronically Signed   By: Narda Rutherford M.D.   On: 03/22/2023 23:39    Pending Labs Unresulted Labs (From admission, onward)     Start     Ordered   03/23/23 0500  Hemoglobin A1c  Tomorrow morning,   R       Comments: To assess prior glycemic control    03/23/23 0226   03/23/23 0227  HIV Antibody (routine testing w rflx)  (HIV Antibody (Routine testing w reflex) panel)  Once,   R        03/23/23 0226            Vitals/Pain Today's Vitals   03/23/23 0100 03/23/23 0335 03/23/23 0518 03/23/23 0644  BP: 120/74 109/64  (!) 141/55  Pulse: 70 70  74  Resp: 18 14 14 18   Temp:      TempSrc:      SpO2: 93% 100% 100% 93%  Weight:      Height:      PainSc:        Isolation Precautions No active isolations  Medications Medications  aspirin EC tablet 81 mg (has no administration in time range)  atorvastatin (LIPITOR) tablet 40 mg (has no administration in time range)  losartan (COZAAR) tablet 50 mg (has no administration in time range)  metoprolol tartrate (LOPRESSOR) tablet 25 mg (25 mg Oral Not Given 03/23/23 0246)  diphenhydrAMINE (BENADRYL) capsule 50 mg (has no administration in time range)  heparin injection 5,000 Units (5,000 Units Subcutaneous Not Given 03/23/23 0641)  0.9 %  sodium chloride infusion ( Intravenous New Bag/Given 03/23/23 0309)  acetaminophen (TYLENOL) tablet 650 mg (has no administration in time range)    Or  acetaminophen (TYLENOL) suppository 650 mg (has no administration in time range)  oxyCODONE (Oxy IR/ROXICODONE) immediate release tablet 5 mg (has no  administration in time range)  morphine (PF) 2 MG/ML injection 2 mg (has no administration in time range)  ondansetron (ZOFRAN) tablet 4 mg (has no administration in time range)    Or  ondansetron (ZOFRAN) injection 4 mg (has no administration in time range)  albuterol (PROVENTIL) (2.5 MG/3ML) 0.083% nebulizer solution 2.5 mg (has no administration in time range)  insulin glargine-yfgn (SEMGLEE) injection 55 Units (has no administration in time range)  insulin aspart (novoLOG) injection 0-15 Units (has no administration in time range)  insulin aspart (novoLOG) injection 0-5 Units (has no administration in time range)  gabapentin (NEURONTIN) capsule 400 mg (400 mg Oral Not Given 03/23/23 0246)  clopidogrel (PLAVIX) tablet 75 mg (has no administration in time range)  isosorbide mononitrate (IMDUR) 24 hr tablet 30 mg (has no administration in time range)  lactated ringers bolus 1,000 mL (0 mLs Intravenous Stopped 03/22/23 2345)    Mobility walks     Focused Assessments    R Recommendations: See Admitting Provider Note  Report given to:   Additional Notes:

## 2023-03-23 NOTE — Assessment & Plan Note (Signed)
-   Status post quadruple bypass - Continue aspirin, statin, Plavix, Imdur, beta-blocker, ARB for medical optimization - Monitor on telemetry - Cardiology consult in a.m.

## 2023-03-23 NOTE — Consult Note (Addendum)
Cardiology Consultation   Patient ID: Brad Olson MRN: [id number]; DOB: Oct 09, 1960  Admit date: 03/22/2023 Date of Consult: 03/23/2023  PCP:  Kelli Hope, MD   [facility] Providers Cardiologist: Followed by Dr. Rockne Menghini in [place], [place]  Patient Profile:   Brad Olson is a 62 y.o. male with a hx of CAD (s/p CABG in 12/2016 with LIMA-LAD, SVG-PDA, SVG-OM and SVG-D1), carotid artery disease (s/p stenting of RICA in 08/2018, angioplasty of RICA in 11/2019, re-angioplasty in 12/2022), OSA (intolerant to CPAP), HTN, HLD and Type 2 DM who is being seen 03/23/2023 for the evaluation of chest pain at the request of Dr. Carren Rang.  History of Present Illness:   Brad Olson presented to [facility] ED on 03/22/2023 for evaluation of chest pain which had started earlier in the day. In talking with the patient and his wife today, he reports being in his normal state of health until yesterday. Says that he is usually very active at baseline and enjoys going hunting and fishing and also does yard work routinely. He has been using a weedeater along with a push mower over the past several weeks without any chest pain or progressive dyspnea on exertion. Yesterday, he reports the wheel came off his riding mower and he was having to push the mower up an incline and developed left pectoral pain with this. Reports it was a stabbing pain which resembled his prior angina. He took sublingual nitroglycerin x 3 and his pain was still present, therefore he presented to the ED for further evaluation. He initially went to [place], [place] ED but they were under lockdown and his wife was unable to accompany him, therefore they drove to [facility]. Reports his pain had resolved by the time he arrived here. He has been pain-free overnight and this morning. He feels back to baseline and is requesting discharge home. He denies any recent palpitations, orthopnea, PND or pitting edema. Does have known OSA but  has been intolerant to CPAP.   Initial labs showed WBC 6.7, Hgb 12.0, platelets 111, K+ 4.5, Na+ 138 and creatinine 1.55 (previously 1.1 when checked in 12/2022 by review of Care Everywhere). Initial and repeat Hs Troponin values have been negative at 6. CXR with no acute findings. EKG shows normal sinus rhythm, HR 81 with first-degree AV block and no acute ST changes when compared to prior tracings.  He did receive 1 L fluid bolus on admission along with being started on IV fluids at 75 mL/h and follow-up labs this morning show his creatinine is improving to 1.41.   Past Medical History:  Diagnosis Date   CAD (coronary artery disease)    a. s/p CABG in 12/2016 with LIMA-LAD, SVG-PDA, SVG-OM and SVG-D1   Hyperlipidemia    Obesity    Tobacco abuse    Type 2 diabetes mellitus (HCC)     Past Surgical History:  Procedure Laterality Date   ABDOMINAL AORTOGRAM N/A 12/22/2016   Procedure: Abdominal Aortogram;  Surgeon: Lennette Bihari, MD;  Location: [facility] INVASIVE CV LAB;  Service: Cardiovascular;  Laterality: N/A;   COLON RESECTION     due to bowel rupture   CORONARY ARTERY BYPASS GRAFT N/A 12/22/2016   Procedure: CORONARY ARTERY BYPASS GRAFTING (CABG) x four using left internal mammary artery and right greater saphenous leg vein using endoscope.  Lima-LAD, SVG-PD, SVG-OM, SVG-Diag.;  Surgeon: Delight Ovens, MD;  Location: [facility] OR;  Service: Open Heart Surgery;  Laterality: N/A;   IABP INSERTION N/A  12/22/2016   Procedure: IABP Insertion;  Surgeon: Lennette Bihari, MD;  Location: [facility] INVASIVE CV LAB;  Service: Cardiovascular;  Laterality: N/A;   LEFT HEART CATH AND CORONARY ANGIOGRAPHY N/A 12/22/2016   Procedure: Left Heart Cath and Coronary Angiography;  Surgeon: Lennette Bihari, MD;  Location: [facility] INVASIVE CV LAB;  Service: Cardiovascular;  Laterality: N/A;   TEE WITHOUT CARDIOVERSION N/A 12/22/2016   Procedure: TRANSESOPHAGEAL ECHOCARDIOGRAM (TEE);  Surgeon: Delight Ovens, MD;  Location: [facility] OR;   Service: Open Heart Surgery;  Laterality: N/A;     Home Medications:  Prior to Admission medications   Medication Sig Start Date End Date Taking? Authorizing Provider  ascorbic acid (VITAMIN C) 500 MG tablet Take 500 mg by mouth daily.   Yes [provider]  aspirin EC 81 MG tablet Take 1 tablet (81 mg total) by mouth daily. 04/22/17  Yes Laqueta Linden, MD  cholecalciferol (VITAMIN D) 1000 units tablet Take 5,000 Units by mouth daily.   Yes [provider]  clopidogrel (PLAVIX) 75 MG tablet Take 75 mg by mouth once.   Yes [provider]  gabapentin (NEURONTIN) 600 MG tablet Take 600 mg by mouth 2 (two) times daily. Lunch and bedtime 01/24/23  Yes [provider]  insulin degludec (TRESIBA) 200 UNIT/ML FlexTouch Pen Inject 55 Units into the skin 2 (two) times daily. 55 units AM and 55 units PM   Yes [provider]  isosorbide mononitrate (IMDUR) 30 MG 24 hr tablet Take 30 mg by mouth daily. 11/10/22  Yes [provider]  losartan (COZAAR) 25 MG tablet Take 50 mg by mouth daily.   Yes [provider]  metoprolol tartrate (LOPRESSOR) 25 MG tablet TAKE 1 TABLET BY MOUTH TWICE A DAY 08/04/18  Yes Laqueta Linden, MD  Multiple Vitamin (MULTIVITAMIN WITH MINERALS) TABS tablet Take 1 tablet by mouth daily.   Yes [provider]  nateglinide (STARLIX) 120 MG tablet Take 120 mg by mouth 3 (three) times daily with meals.   Yes [provider]  nitroGLYCERIN (NITROSTAT) 0.4 MG SL tablet PLACE 1 TABLET UNDER THE TONGUE EVERY 5 MINUTES AS NEEDED 08/17/19  Yes Laqueta Linden, MD  Omega-3 Fatty Acids (FISH OIL) 1000 MG CAPS Take 1 capsule by mouth 2 (two) times daily.   Yes [provider]  oxymetazoline (AFRIN) 0.05 % nasal spray Place 2 sprays into both nostrils 2 (two) times daily as needed (allergies).   Yes [provider]  pantoprazole (PROTONIX) 40 MG tablet Take 40 mg by mouth daily. 11/10/22   Yes [provider]  rosuvastatin (CRESTOR) 20 MG tablet Take 20 mg by mouth at bedtime.   Yes [provider]  Semaglutide, 2 MG/DOSE, (OZEMPIC, 2 MG/DOSE,) 8 MG/3ML SOPN Inject 2 mg into the skin once a week.   Yes [provider]  sitaGLIPtin (JANUVIA) 50 MG tablet Take 50 mg by mouth daily.   Yes [provider]  tamsulosin (FLOMAX) 0.4 MG CAPS capsule Take 0.4 mg by mouth at bedtime.   Yes [provider]  Vitamin E 180 MG (400 UNIT) CAPS Take 400 Units by mouth every morning.   Yes [provider]  atorvastatin (LIPITOR) 40 MG tablet TAKE 1 TABLET BY MOUTH EVERY DAY Patient not taking: Reported on 03/23/2023 03/30/18   Laqueta Linden, MD  blood glucose meter kit and supplies KIT Dispense based on patient and insurance preference. Use up to 3 times daily as directed. (FOR  ICD-10 E11.65) 03/02/18   Roma Kayser, MD  diphenhydrAMINE (BENADRYL) 25 MG tablet Take 25 mg by mouth at bedtime. Patient not taking: Reported on 03/23/2023    [provider]  glucose blood test strip 1 each by Other route 4 (four) times daily. Use as instructed 4 x daily. E11.65 09/27/17   Roma Kayser, MD  LANTUS SOLOSTAR 100 UNIT/ML Solostar Pen INJECT 90 UNITS SUBCUTANEOUSLY AT BEDTIME Patient not taking: Reported on 03/23/2023 06/27/18   Roma Kayser, MD  Melatonin 10 MG TABS Take 1 tablet by mouth at bedtime. Patient not taking: Reported on 03/23/2023    [provider]  metFORMIN (GLUCOPHAGE) 1000 MG tablet TAKE 1 TABLET BY MOUTH TWICE A DAY WITH A MEAL Patient not taking: Reported on 03/23/2023 05/31/18   Roma Kayser, MD  Probiotic Product (PROBIOTIC-10) CAPS Take by mouth. Patient not taking: Reported on 03/23/2023    [provider]    Inpatient Medications: Scheduled Meds:  aspirin EC  81 mg Oral Daily   clopidogrel  75 mg Oral Daily   diphenhydrAMINE  50 mg Oral QHS   gabapentin  400 mg  Oral BID   heparin  5,000 Units Subcutaneous Q8H   insulin aspart  0-15 Units Subcutaneous TID WC   insulin aspart  0-5 Units Subcutaneous QHS   insulin glargine-yfgn  55 Units Subcutaneous QHS   isosorbide mononitrate  30 mg Oral Daily   losartan  50 mg Oral Daily   metoprolol tartrate  25 mg Oral BID   rosuvastatin  20 mg Oral Daily   Continuous Infusions:  sodium chloride Stopped (03/23/23 0748)   PRN Meds: acetaminophen **OR** acetaminophen, albuterol, morphine injection, ondansetron **OR** ondansetron (ZOFRAN) IV, oxyCODONE  Allergies:    Allergies  Allergen Reactions   Wound Dressing Adhesive Dermatitis   Varenicline Tartrate Nausea And Vomiting   Lisinopril Cough   Nicotine Rash    Allergic to Nicotine patch.    Social History:   Social History   Socioeconomic History   Marital status: Married    Spouse name: Not on file   Number of children: Not on file   Years of education: Not on file   Highest education level: Not on file  Occupational History   Not on file  Tobacco Use   Smoking status: Every Day    Years: 30    Types: Cigarettes    Start date: 07/16/1982   Smokeless tobacco: Never   Tobacco comments:    8-10 cigarettes per day  Vaping Use   Vaping Use: Former  Substance and Sexual Activity   Alcohol use: Yes    Comment: Very rarely   Drug use: No   Sexual activity: Not on file  Other Topics Concern   Not on file  Social History Narrative   Not on file   Social Determinants of Health   Financial Resource Strain: Not on file  Food Insecurity: Not on file  Transportation Needs: Not on file  Physical Activity: Not on file  Stress: Not on file  Social Connections: Not on file  Intimate Partner Violence: Not on file    Family History:    Family History  Problem Relation Age of Onset   CAD Father        died of massive MI at age 21, first MI several years earlier   Lung disease Father      ROS:  Please see the history of present  illness.  All other  ROS reviewed and negative.     Physical Exam/Data:   Vitals:   03/23/23 0335 03/23/23 0518 03/23/23 0644 03/23/23 0728  BP: 109/64  (!) 141/55   Pulse: 70  74   Resp: 14 14 18    Temp:    97.9 F (36.6 C)  TempSrc:    Oral  SpO2: 100% 100% 93%   Weight:      Height:        Intake/Output Summary (Last 24 hours) at 03/23/2023 0843 Last data filed at 03/23/2023 0748 Gross per 24 hour  Intake 1348.75 ml  Output --  Net 1348.75 ml      03/22/2023    7:32 PM 04/11/2018   11:42 AM 01/31/2018   11:31 AM  Last 3 Weights  Weight (lbs) 235 lb 246 lb 6.4 oz 244 lb  Weight (kg) 106.595 kg 111.766 kg 110.678 kg     Body mass index is 32.78 kg/m.  General:  Well nourished, well developed male appearing in no acute distress.  HEENT: normal Neck: no JVD Vascular: No carotid bruits; Distal pulses 2+ bilaterally Cardiac:  normal S1, S2; RRR; no murmur  Lungs:  clear to auscultation bilaterally, no wheezing, rhonchi or rales  Abd: soft, nontender, no hepatomegaly  Ext: no pitting edema Musculoskeletal:  No deformities, BUE and BLE strength normal and equal Skin: warm and dry  Neuro:  CNs 2-12 intact, no focal abnormalities noted Psych:  Normal affect   EKG:  The EKG was personally reviewed and demonstrates: Normal sinus rhythm, HR  81 with first-degree AV block and no acute ST changes when compared to prior tracings. Telemetry:  Telemetry was personally reviewed and demonstrates: NSR, HR in 50's to 70's.   Relevant CV Studies:  Echocardiogram: 08/2017 Study Conclusions   - Left ventricle: The cavity size was normal. Wall thickness was    increased in a pattern of mild LVH. Systolic function was normal.    The estimated ejection fraction was in the range of 60% to 65%.    Wall motion was normal; there were no regional wall motion    abnormalities. Left ventricular diastolic function parameters    were normal.  - Aortic valve: Valve area (VTI): 2.89 cm^2. Valve  area (Vmax): 2.7    cm^2.  - Atrial septum: No defect or patent foramen ovale was identified.  - Technically difficult study. Echocontrast was used to enhance    visualization.   Laboratory Data:  High Sensitivity Troponin:   Recent Labs  Lab 03/22/23 1939 03/22/23 2155  TROPONINIHS 5 6     Chemistry Recent Labs  Lab 03/22/23 1939 03/23/23 0340  NA 138 136  K 4.5 4.1  CL 102 102  CO2 25 25  GLUCOSE 146* 204*  BUN 35* 36*  CREATININE 1.55* 1.41*  CALCIUM 9.0 8.7*  MG  --  1.9  GFRNONAA 51* 57*  ANIONGAP 11 9    Recent Labs  Lab 03/23/23 0340  PROT 6.6  ALBUMIN 3.9  AST 25  ALT 21  ALKPHOS 45  BILITOT 0.6    Hematology Recent Labs  Lab 03/22/23 1939 03/23/23 0340  WBC 6.7 6.6  RBC 3.74* 3.62*  HGB 12.0* 11.6*  HCT 35.3* 35.0*  MCV 94.4 96.7  MCH 32.1 32.0  MCHC 34.0 33.1  RDW 13.3 13.3  PLT 111* 101*    Radiology/Studies:  DG Chest Portable 1 View  Result Date: 03/22/2023 CLINICAL DATA:  Left-sided chest pain. EXAM: PORTABLE CHEST 1 VIEW COMPARISON:  01/24/2017 FINDINGS: Median sternotomy.The cardiomediastinal contours are stable, heart size is unchanged. Stable right lung granuloma. Patient's chin partially obscures the apices. Pulmonary vasculature is normal. No consolidation, pleural effusion, or pneumothorax. No acute osseous abnormalities are seen. IMPRESSION: No acute chest findings. Median sternotomy. Electronically Signed   By: Narda Rutherford M.D.   On: 03/22/2023 23:39     Assessment and Plan:   1. Chest Pain concerning for a Cardiac Etiology - Suspect his episode yesterday was secondary to exertional angina given that his activity at that time was above his usual baseline. He has ruled out for ACS and denies any recurrent pain overnight or this morning. - Reviewed options with the patient in regards to inpatient stress testing vs. outpatient follow-up and he is requesting discharge and plans to follow-up with his primary cardiologist. We  discussed that he would likely benefit from an outpatient stress test at that time given no recent ischemic evaluation. Reviewed return precautions and he is aware to present back to the ED if he has recurrent pain in the interim which does not resolve with SL NTG.   2. CAD - He is s/p CABG in 12/2016 with LIMA-LAD, SVG-PDA, SVG-OM and SVG-D1. Would recommend outpatient stress testing as described above.  - Remains on ASA, Plavix (on this for his carotid artery disease), Lopressor, Imdur and Crestor.   3. Carotid Artery Disease - He is s/p stenting of RICA in 08/2018 with angioplasty of RICA in 11/2019 and re-angioplasty in 12/2022. Followed by Vascular at [facility]. Remains on ASA, Plavix and statin therapy.   4. HTN - BP has overall been well-controlled, at 109/64 - 141/55 since admission. Continue Losartan, Imdur and Lopressor.   5. HLD - Followed by his PCP. He remains on Crestor 20mg  daily.   6. AKI - Creatinine was at 1.1 in 12/2022, elevated to 1.55 on admission. Improved to 1.41 today after receiving IV fluids. Would recommend a repeat BMET in 7-10 days. Not listed as being on a diuretic prior to admission but does take once weekly Ozempic. We reviewed the importance of adequate hydration and temperature precautions when working outside given his cardiovascular disease.    For questions or updates, please contact [facility] Please consult [email or web address] for contact info under    Signed, Brad Lennox, PA-C  03/23/2023 8:43 AM    Attending note:  Patient seen and examined.  Records reviewed and case discussed with Ms. Patrick Jupiter, agree with her above findings.  Brad Olson follows with Dr. Rockne Menghini in [place] for cardiology care.  States that he has done well from a symptom perspective on medical therapy, although yesterday while mowing his lawn he had a problem with his riding lawn more and had to push it for a distance.  With this he experienced angina which was  more prolonged and required nitroglycerin.  Symptoms ultimately resolved without further intervention although he did come in for further assessment.  Under observation in the ER he has had no further chest discomfort and has ruled out for ACS.  On examination he appears comfortable.  Afebrile, heart rate in the 60s to 70s in sinus rhythm by telemetry, last blood pressure 127/54.  Lungs are clear.  Cardiac exam with RRR and no gallop, soft systolic murmur.  No peripheral edema.  Pertinent lab work includes potassium 4.1, BUN 36, creatinine 1.41, normal high-sensitivity troponin I levels, hemoglobin 11.6, platelets 101, glucose 204.  Chest x-ray reports no acute process.  ECG shows sinus rhythm  with borderline prolonged QT interval and no acute ST segment changes.  Patient presents after episode of effort angina and has ruled out for ACS.  He is status post CABG with LIMA to LAD, SVG to PDA, SVG to OM, and SVG to first diagonal in 2018.  He would very much like to go home and continue to follow-up with his primary cardiologist.  States that he has a visit in the next 2 weeks.  I think this is reasonable, he should continue medical therapy and be observant for any other change in cardiac symptoms.  He has not had a follow-up stress test recently and this would certainly be a consideration at follow-up for reevaluation of ischemic burden.  We did remind him of ER return precautions, particularly if he starts to now experience accelerating angina after this initial episode.  Jonelle Sidle, M.D., F.A.C.C.

## 2023-03-23 NOTE — ED Notes (Signed)
Pt preferred to take regular morning medications at home after breakfast. Pt verbalized he would rather be discharged then stay in the hospital. Cardiology agreed to allow him to follow up out patient for his cardiac need. Pt and spouse verbalized understanding and had no further questions at the time of discharge. Pt is alert and oriented at baseline. Pt urinated at time of discharge. Pt breath was even and unlabored at time of discharge. Pt gait was steady at time of discharge. Pt refused wheelchair or assistance to the lobby at time of discharge.

## 2023-03-23 NOTE — ED Notes (Signed)
MD is at bedside.

## 2023-03-23 NOTE — ED Notes (Addendum)
Pt and wife continuing to push back on an admission to the hospital. Cardiology is at the bedside. Rn awaiting finalized orders.

## 2023-03-23 NOTE — Assessment & Plan Note (Signed)
--  Continue ARB 

## 2023-03-23 NOTE — Assessment & Plan Note (Signed)
-   Patient takes 55 units of insulin twice daily at home - Continue 55 units once daily here - Sliding scale coverage - Currently n.p.o. patient seen by cardiology in the a.m.

## 2023-03-23 NOTE — Assessment & Plan Note (Signed)
-   Last creatinine was 12/22/2022 and was 1.1 - Today's creatinine is 1.55 - Likely related to dehydration as patient was working in hot sun and reports his clothes were remaining wet with sweat -  encourage e p.o. fluid intake and trend in the a.m. - If no improvement in the a.m. consider holding ARB, NSAIDs, other nephrotoxic agents

## 2023-03-23 NOTE — Assessment & Plan Note (Signed)
-   Down to 1 pack/day - Allergic to nicotine patches - Offered nicotine gum, but denied - Advised on the importance of quitting

## 2023-03-23 NOTE — Assessment & Plan Note (Signed)
-   Continue statin medication  

## 2023-03-23 NOTE — TOC CM/SW Note (Signed)
Transition of Care [facility]) - Inpatient Brief Assessment   Patient Details  Name: Brad Olson MRN: [id number] Date of Birth: 02-10-1961  Transition of Care [facility]) CM/SW Contact:    Elliot Gault, LCSW Phone Number: 03/23/2023, 9:02 AM   Clinical Narrative:  Transition of Care Department [facility]) has reviewed patient and no TOC needs have been identified at this time. We will continue to monitor patient advancement through interdisciplinary progression rounds. If new patient transition needs arise, please place a TOC consult.  Transition of Care Asessment: Insurance and Status: Insurance coverage has been reviewed Patient has primary care physician: Yes Home environment has been reviewed: from home Prior level of function:: independent Prior/Current Home Services: No current home services Social Determinants of Health Reivew: SDOH reviewed no interventions necessary Readmission risk has been reviewed: Yes Transition of care needs: no transition of care needs at this time
